# Patient Record
Sex: Female | Born: 1935 | Race: White | Hispanic: No | Smoking: Never smoker
Health system: Southern US, Community
[De-identification: ages and names within clinical notes are randomized; demographics above are authoritative.]

## PROBLEM LIST (undated history)

## (undated) DIAGNOSIS — I10 Essential (primary) hypertension: Secondary | ICD-10-CM

## (undated) DIAGNOSIS — R0600 Dyspnea, unspecified: Secondary | ICD-10-CM

## (undated) DIAGNOSIS — E785 Hyperlipidemia, unspecified: Secondary | ICD-10-CM

## (undated) DIAGNOSIS — S022XXA Fracture of nasal bones, initial encounter for closed fracture: Secondary | ICD-10-CM

## (undated) DIAGNOSIS — H3530 Unspecified macular degeneration: Secondary | ICD-10-CM

## (undated) DIAGNOSIS — N189 Chronic kidney disease, unspecified: Secondary | ICD-10-CM

## (undated) DIAGNOSIS — S42209A Unspecified fracture of upper end of unspecified humerus, initial encounter for closed fracture: Secondary | ICD-10-CM

## (undated) HISTORY — PX: TONSILLECTOMY: SUR1361

## (undated) HISTORY — PX: CHOLECYSTECTOMY: SHX55

---

## 2003-08-05 ENCOUNTER — Inpatient Hospital Stay (HOSPITAL_COMMUNITY): Admission: EM | Admit: 2003-08-05 | Discharge: 2003-08-07 | Payer: Self-pay | Admitting: Emergency Medicine

## 2019-03-24 ENCOUNTER — Emergency Department (HOSPITAL_COMMUNITY)
Admission: EM | Admit: 2019-03-24 | Discharge: 2019-03-24 | Disposition: A | Discharge status: Home / Self Care | Payer: Medicare Other | Attending: Emergency Medicine | Admitting: Emergency Medicine

## 2019-03-24 ENCOUNTER — Other Ambulatory Visit: Payer: Self-pay

## 2019-03-24 ENCOUNTER — Emergency Department (HOSPITAL_COMMUNITY): Payer: Medicare Other

## 2019-03-24 DIAGNOSIS — Y999 Unspecified external cause status: Secondary | ICD-10-CM | POA: Diagnosis not present

## 2019-03-24 DIAGNOSIS — W010XXA Fall on same level from slipping, tripping and stumbling without subsequent striking against object, initial encounter: Secondary | ICD-10-CM | POA: Diagnosis not present

## 2019-03-24 DIAGNOSIS — N189 Chronic kidney disease, unspecified: Secondary | ICD-10-CM | POA: Insufficient documentation

## 2019-03-24 DIAGNOSIS — Y92009 Unspecified place in unspecified non-institutional (private) residence as the place of occurrence of the external cause: Secondary | ICD-10-CM | POA: Insufficient documentation

## 2019-03-24 DIAGNOSIS — Y9301 Activity, walking, marching and hiking: Secondary | ICD-10-CM | POA: Diagnosis not present

## 2019-03-24 DIAGNOSIS — S42201A Unspecified fracture of upper end of right humerus, initial encounter for closed fracture: Secondary | ICD-10-CM | POA: Insufficient documentation

## 2019-03-24 DIAGNOSIS — I129 Hypertensive chronic kidney disease with stage 1 through stage 4 chronic kidney disease, or unspecified chronic kidney disease: Secondary | ICD-10-CM | POA: Diagnosis not present

## 2019-03-24 DIAGNOSIS — S022XXA Fracture of nasal bones, initial encounter for closed fracture: Secondary | ICD-10-CM | POA: Insufficient documentation

## 2019-03-24 DIAGNOSIS — Z23 Encounter for immunization: Secondary | ICD-10-CM | POA: Insufficient documentation

## 2019-03-24 DIAGNOSIS — S4991XA Unspecified injury of right shoulder and upper arm, initial encounter: Secondary | ICD-10-CM | POA: Diagnosis present

## 2019-03-24 LAB — BASIC METABOLIC PANEL
Anion gap: 11 (ref 5–15)
BUN: 18 mg/dL (ref 8–23)
CO2: 23 mmol/L (ref 22–32)
Calcium: 8.6 mg/dL — ABNORMAL LOW (ref 8.9–10.3)
Chloride: 105 mmol/L (ref 98–111)
Creatinine, Ser: 1.47 mg/dL — ABNORMAL HIGH (ref 0.44–1.00)
GFR calc Af Amer: 36 mL/min — ABNORMAL LOW (ref >60)
GFR calc non Af Amer: 31 mL/min — ABNORMAL LOW (ref >60)
Glucose, Bld: 160 mg/dL — ABNORMAL HIGH (ref 70–99)
Potassium: 3.9 mmol/L (ref 3.5–5.1)
Sodium: 139 mmol/L (ref 135–145)

## 2019-03-24 LAB — CBC WITH DIFFERENTIAL/PLATELET
Abs Immature Granulocytes: 0.06 10*3/uL (ref 0.00–0.07)
Basophils Absolute: 0 10*3/uL (ref 0.0–0.1)
Basophils Relative: 0 %
Eosinophils Absolute: 0.2 10*3/uL (ref 0.0–0.5)
Eosinophils Relative: 3 %
HCT: 36.1 % (ref 36.0–46.0)
Hemoglobin: 11.6 g/dL — ABNORMAL LOW (ref 12.0–15.0)
Immature Granulocytes: 1 %
Lymphocytes Relative: 13 %
Lymphs Abs: 1.1 10*3/uL (ref 0.7–4.0)
MCH: 33.1 pg (ref 26.0–34.0)
MCHC: 32.1 g/dL (ref 30.0–36.0)
MCV: 103.1 fL — ABNORMAL HIGH (ref 80.0–100.0)
Monocytes Absolute: 0.8 10*3/uL (ref 0.1–1.0)
Monocytes Relative: 9 %
Neutro Abs: 6.1 10*3/uL (ref 1.7–7.7)
Neutrophils Relative %: 74 %
Platelets: 174 10*3/uL (ref 150–400)
RBC: 3.5 MIL/uL — ABNORMAL LOW (ref 3.87–5.11)
RDW: 13.7 % (ref 11.5–15.5)
WBC: 8.3 10*3/uL (ref 4.0–10.5)
nRBC: 0 % (ref 0.0–0.2)

## 2019-03-24 LAB — TROPONIN I (HIGH SENSITIVITY): Troponin I (High Sensitivity): 7 ng/L (ref <18)

## 2019-03-24 MED ORDER — CLINDAMYCIN HCL 300 MG PO CAPS: 300.0000 mg | ORAL_CAPSULE | Freq: Three times a day (TID) | ORAL | 0 refills | Status: DC

## 2019-03-24 MED ORDER — CLINDAMYCIN HCL 150 MG PO CAPS
300.0000 mg | ORAL_CAPSULE | Freq: Once | ORAL | Status: AC
  Administered 2019-03-24: 19:00:00 300 mg via ORAL
  Filled 2019-03-24: qty 2

## 2019-03-24 MED ORDER — TRAMADOL HCL 50 MG PO TABS: 50.0000 mg | ORAL_TABLET | Freq: Four times a day (QID) | ORAL | 0 refills | Status: DC | PRN

## 2019-03-24 MED ORDER — TETANUS-DIPHTH-ACELL PERTUSSIS 5-2.5-18.5 LF-MCG/0.5 IM SUSP
0.5000 mL | Freq: Once | INTRAMUSCULAR | Status: AC
  Administered 2019-03-24: 0.5 mL via INTRAMUSCULAR
  Filled 2019-03-24: qty 0.5

## 2019-03-24 NOTE — Discharge Instructions (Addendum)
Take clindamycin 300 three times daily for a week. You have some nasal bone fractures that are small and you can follow up with ENT.   You have humerus (shoulder) fracture. Wear your sling   Take tylenol for pain   Take tramadol for severe pain   See primary care doctor and ENT and orthopedic doctor   Return to ER if you have severe pain, vomiting, headaches, lethargy, unable to walk

## 2019-03-24 NOTE — Progress Notes (Signed)
Orthopedic Tech Progress Note Patient Details:  Yesenia Ramsey February 01, 1925 [id number]  Ortho Devices Type of Ortho Device: Shoulder immobilizer Ortho Device/Splint Location: right Ortho Device/Splint Interventions: Application   Post Interventions Patient Tolerated: Well Instructions Provided: Care of device   [place] Pink 03/24/2019, 6:58 PM

## 2019-03-24 NOTE — ED Triage Notes (Signed)
Pt bib ems from home with reports of trip and fall onto concrete. Pt hit her R shoulder and face. Pt with abrasions to nose and above lip. Pt with swelling to R eye as well as ecchymosis. Swelling to R shoulder. Pt given 159mcg fentanyl en route. 127/55, HR 78, 93% RA, 97.7. pt alert and oriented X4.

## 2019-03-24 NOTE — ED Notes (Signed)
Patient Alert and oriented to baseline. Stable and ambulatory to baseline. Patient verbalized understanding of the discharge instructions.  Patient belongings were taken by the patient.   

## 2019-03-24 NOTE — ED Provider Notes (Signed)
[facility] Provider Note   CSN: [id number] Arrival date & time: 03/24/19  1546    History   Chief Complaint Chief Complaint  Patient presents with   Fall   Shoulder Pain    HPI Yesenia Ramsey is a 85 y.o. female history of hypertension, hyperlipidemia, kidney disease here presenting with fall.  Patient lives at home with her son.  Patient states that she had a trip and fall on hit her right shoulder and face on concrete.  She was noted to have obvious right shoulder deformity and ecchymosis on bilateral nasal bones.  There was some bleeding from the face as well     The history is provided by the patient.    No past medical history on file.  There are no active problems to display for this patient.   OB History   No obstetric history on file.      Home Medications    Prior to Admission medications   Medication Sig Start Date End Date Taking? Authorizing Provider  clindamycin (CLEOCIN) 300 MG capsule Take 1 capsule (300 mg total) by mouth 3 (three) times daily. X 7 days 03/24/19   Drenda Freeze, MD  traMADol (ULTRAM) 50 MG tablet Take 1 tablet (50 mg total) by mouth every 6 (six) hours as needed. 03/24/19   Drenda Freeze, MD    Family History No family history on file.  Social History Social History   Tobacco Use   Smoking status: Not on file  Substance Use Topics   Alcohol use: Not on file   Drug use: Not on file     Allergies   Amoxicillin   Review of Systems Review of Systems  HENT: Positive for facial swelling.   Musculoskeletal:       R shoulder pain   Skin: Positive for wound.  All other systems reviewed and are negative.    Physical Exam Updated Vital Signs BP (!) 158/60    Pulse 78    Temp 97.8 F (36.6 C) (Oral)    Resp 19    Ht 5' (1.524 m)    Wt 52.2 kg    SpO2 94%    BMI 22.46 kg/m   Physical Exam Vitals signs and nursing note reviewed.  Constitutional:      Comments:  Chronically ill, NAD   HENT:     Head:     Comments: Abrasion L scalp no laceration. There is 0.5 cm laceration of the bridge of the nose and 0.5 cm laceration above R lip. There is tenderness of the bilateral zygomatic arch with no obvious deformity     Mouth/Throat:     Mouth: Mucous membranes are moist.  Eyes:     Extraocular Movements: Extraocular movements intact.     Pupils: Pupils are equal, round, and reactive to light.  Neck:     Musculoskeletal: Normal range of motion.  Cardiovascular:     Rate and Rhythm: Normal rate and regular rhythm.     Pulses: Normal pulses.     Heart sounds: Normal heart sounds.  Pulmonary:     Effort: Pulmonary effort is normal.     Breath sounds: Normal breath sounds.  Abdominal:     General: Abdomen is flat.     Palpations: Abdomen is soft.  Musculoskeletal:     Comments: No midline spinal tenderness, nl ROM bilateral hips. There is obvious R proximal humerus deformity and tenderness. No elbow or forearm tenderness. No other  extremity trauma   Skin:    General: Skin is warm.     Capillary Refill: Capillary refill takes less than 2 seconds.  Neurological:     General: No focal deficit present.     Mental Status: She is alert and oriented to person, place, and time.     Cranial Nerves: No cranial nerve deficit.     Sensory: No sensory deficit.  Psychiatric:        Mood and Affect: Mood normal.        Behavior: Behavior normal.      ED Treatments / Results  Labs (all labs ordered are listed, but only abnormal results are displayed) Labs Reviewed  CBC WITH DIFFERENTIAL/PLATELET - Abnormal; Notable for the following components:      Result Value   RBC 3.50 (*)    Hemoglobin 11.6 (*)    MCV 103.1 (*)    All other components within normal limits  BASIC METABOLIC PANEL - Abnormal; Notable for the following components:   Glucose, Bld 160 (*)    Creatinine, Ser 1.47 (*)    Calcium 8.6 (*)    GFR calc non Af Amer 31 (*)    GFR calc Af  Amer 36 (*)    All other components within normal limits  TROPONIN I (HIGH SENSITIVITY)    EKG EKG Interpretation  Date/Time:  Thursday March 24 2019 15:58:21 EDT Ventricular Rate:  75 PR Interval:    QRS Duration: 82 QT Interval:  541 QTC Calculation: 605 R Axis:   57 Text Interpretation:  Sinus rhythm Borderline T abnormalities, inferior leads Prolonged QT interval Since last tracing rate slower Confirmed by Richardean CanalYao, Leahanna Buser H [phone]) on 03/24/2019 4:25:58 PM   Radiology Dg Chest 1 View  Result Date: 03/24/2019 CLINICAL DATA:  Fall with right shoulder pain EXAM: CHEST  1 VIEW COMPARISON:  None FINDINGS: Small left-sided pleural effusion. Normal heart size. Aortic atherosclerosis. No pneumothorax. Acute fracture proximal right humerus. IMPRESSION: Small left-sided pleural effusion. Acute fracture proximal right humerus Electronically Signed   By: Jasmine PangKim  Fujinaga M.D.   On: 03/24/2019 16:43   Dg Pelvis 1-2 Views  Result Date: 03/24/2019 CLINICAL DATA:  Fall EXAM: PELVIS - 1-2 VIEW COMPARISON:  None. FINDINGS: SI joints are non widened. Pubic symphysis and rami are intact. No fracture or malalignment. IMPRESSION: No acute osseous abnormality Electronically Signed   By: Jasmine PangKim  Fujinaga M.D.   On: 03/24/2019 16:49   Dg Shoulder Right  Result Date: 03/24/2019 CLINICAL DATA:  Fall EXAM: RIGHT SHOULDER - 2+ VIEW COMPARISON:  None. FINDINGS: Moderate AC joint degenerative change. Acute mildly comminuted and impacted fracture involving the right humeral neck with close to 1 bone with of anterior displacement of distal fracture fragment. The humeral head appears to project over the glenoid fossa. IMPRESSION: Acute mildly comminuted and impacted right humeral neck fracture. Electronically Signed   By: Jasmine PangKim  Fujinaga M.D.   On: 03/24/2019 16:48   Ct Head Wo Contrast  Result Date: 03/24/2019 CLINICAL DATA:  Trip and fall at home onto concrete. Swelling and ecchymosis to right eye. Head trauma, intracranial  venous injury suspected; Head trauma, CSF leak suspected; C-spine trauma, high clinical risk (NEXUS/CCR) EXAM: CT HEAD WITHOUT CONTRAST CT MAXILLOFACIAL WITHOUT CONTRAST CT CERVICAL SPINE WITHOUT CONTRAST TECHNIQUE: Multidetector CT imaging of the head, cervical spine, and maxillofacial structures were performed using the standard protocol without intravenous contrast. Multiplanar CT image reconstructions of the cervical spine and maxillofacial structures were also generated. COMPARISON:  None.  FINDINGS: CT HEAD FINDINGS Brain: No evidence of acute infarction, hemorrhage, hydrocephalus, extra-axial collection or mass lesion/mass effect. Age related atrophy. Moderate chronic small vessel ischemia. Remote lacunar infarct in the left caudate/thalamus. Vascular: Hyperdense vessel. Skull: No fracture or focal lesion. Other: None. CT MAXILLOFACIAL FINDINGS Osseous: Comminuted and depressed bilateral nasal bone fractures. Slight rightward nasal septal deviation. Possible but not definite and right anterior alveolar ridge fracture. Upper dentures in place. Zygomatic arches are intact. Mandibles are intact, temporomandibular joints are congruent with bilateral degenerative change. Orbits: No acute orbital fracture. No globe injury. Bilateral lens extraction. Right periorbital hematoma. Sinuses: No sinus fracture or fluid level. Slight mucosal thickening of ethmoid air cells. The mastoid air cells are clear. Soft tissues: Right periorbital hematoma and edema. CT CERVICAL SPINE FINDINGS Alignment: No traumatic subluxation. Slight anterolisthesis of C4 on C5 and retrolisthesis of C5 on C6 appears degenerative. Skull base and vertebrae: No acute fracture. The dens and skull base are intact. No focal bone abnormality. Soft tissues and spinal canal: No prevertebral fluid or swelling. No visible canal hematoma. Disc levels: Disc space narrowing and endplate spurring is most prominent at C5-C6. Multilevel facet hypertrophy. Upper  chest: Biapical pleuroparenchymal scarring. No acute findings. Other: None. IMPRESSION: 1. No acute intracranial abnormality. No skull fracture. Age related atrophy and chronic small vessel ischemia. 2. Comminuted and depressed bilateral nasal bone fractures. Possible but not definite right anterior alveolar ridge fracture. 3. Right periorbital hematoma and edema. No orbital fracture. 4. Degenerative change in the cervical spine without acute fracture or subluxation. Electronically Signed   By: Narda RutherfordMelanie  Sanford M.D.   On: 03/24/2019 17:14   Ct Cervical Spine Wo Contrast  Result Date: 03/24/2019 CLINICAL DATA:  Trip and fall at home onto concrete. Swelling and ecchymosis to right eye. Head trauma, intracranial venous injury suspected; Head trauma, CSF leak suspected; C-spine trauma, high clinical risk (NEXUS/CCR) EXAM: CT HEAD WITHOUT CONTRAST CT MAXILLOFACIAL WITHOUT CONTRAST CT CERVICAL SPINE WITHOUT CONTRAST TECHNIQUE: Multidetector CT imaging of the head, cervical spine, and maxillofacial structures were performed using the standard protocol without intravenous contrast. Multiplanar CT image reconstructions of the cervical spine and maxillofacial structures were also generated. COMPARISON:  None. FINDINGS: CT HEAD FINDINGS Brain: No evidence of acute infarction, hemorrhage, hydrocephalus, extra-axial collection or mass lesion/mass effect. Age related atrophy. Moderate chronic small vessel ischemia. Remote lacunar infarct in the left caudate/thalamus. Vascular: Hyperdense vessel. Skull: No fracture or focal lesion. Other: None. CT MAXILLOFACIAL FINDINGS Osseous: Comminuted and depressed bilateral nasal bone fractures. Slight rightward nasal septal deviation. Possible but not definite and right anterior alveolar ridge fracture. Upper dentures in place. Zygomatic arches are intact. Mandibles are intact, temporomandibular joints are congruent with bilateral degenerative change. Orbits: No acute orbital fracture.  No globe injury. Bilateral lens extraction. Right periorbital hematoma. Sinuses: No sinus fracture or fluid level. Slight mucosal thickening of ethmoid air cells. The mastoid air cells are clear. Soft tissues: Right periorbital hematoma and edema. CT CERVICAL SPINE FINDINGS Alignment: No traumatic subluxation. Slight anterolisthesis of C4 on C5 and retrolisthesis of C5 on C6 appears degenerative. Skull base and vertebrae: No acute fracture. The dens and skull base are intact. No focal bone abnormality. Soft tissues and spinal canal: No prevertebral fluid or swelling. No visible canal hematoma. Disc levels: Disc space narrowing and endplate spurring is most prominent at C5-C6. Multilevel facet hypertrophy. Upper chest: Biapical pleuroparenchymal scarring. No acute findings. Other: None. IMPRESSION: 1. No acute intracranial abnormality. No skull fracture. Age related atrophy and  chronic small vessel ischemia. 2. Comminuted and depressed bilateral nasal bone fractures. Possible but not definite right anterior alveolar ridge fracture. 3. Right periorbital hematoma and edema. No orbital fracture. 4. Degenerative change in the cervical spine without acute fracture or subluxation. Electronically Signed   By: Narda Rutherford M.D.   On: 03/24/2019 17:14   Ct Maxillofacial Wo Contrast  Result Date: 03/24/2019 CLINICAL DATA:  Trip and fall at home onto concrete. Swelling and ecchymosis to right eye. Head trauma, intracranial venous injury suspected; Head trauma, CSF leak suspected; C-spine trauma, high clinical risk (NEXUS/CCR) EXAM: CT HEAD WITHOUT CONTRAST CT MAXILLOFACIAL WITHOUT CONTRAST CT CERVICAL SPINE WITHOUT CONTRAST TECHNIQUE: Multidetector CT imaging of the head, cervical spine, and maxillofacial structures were performed using the standard protocol without intravenous contrast. Multiplanar CT image reconstructions of the cervical spine and maxillofacial structures were also generated. COMPARISON:  None.  FINDINGS: CT HEAD FINDINGS Brain: No evidence of acute infarction, hemorrhage, hydrocephalus, extra-axial collection or mass lesion/mass effect. Age related atrophy. Moderate chronic small vessel ischemia. Remote lacunar infarct in the left caudate/thalamus. Vascular: Hyperdense vessel. Skull: No fracture or focal lesion. Other: None. CT MAXILLOFACIAL FINDINGS Osseous: Comminuted and depressed bilateral nasal bone fractures. Slight rightward nasal septal deviation. Possible but not definite and right anterior alveolar ridge fracture. Upper dentures in place. Zygomatic arches are intact. Mandibles are intact, temporomandibular joints are congruent with bilateral degenerative change. Orbits: No acute orbital fracture. No globe injury. Bilateral lens extraction. Right periorbital hematoma. Sinuses: No sinus fracture or fluid level. Slight mucosal thickening of ethmoid air cells. The mastoid air cells are clear. Soft tissues: Right periorbital hematoma and edema. CT CERVICAL SPINE FINDINGS Alignment: No traumatic subluxation. Slight anterolisthesis of C4 on C5 and retrolisthesis of C5 on C6 appears degenerative. Skull base and vertebrae: No acute fracture. The dens and skull base are intact. No focal bone abnormality. Soft tissues and spinal canal: No prevertebral fluid or swelling. No visible canal hematoma. Disc levels: Disc space narrowing and endplate spurring is most prominent at C5-C6. Multilevel facet hypertrophy. Upper chest: Biapical pleuroparenchymal scarring. No acute findings. Other: None. IMPRESSION: 1. No acute intracranial abnormality. No skull fracture. Age related atrophy and chronic small vessel ischemia. 2. Comminuted and depressed bilateral nasal bone fractures. Possible but not definite right anterior alveolar ridge fracture. 3. Right periorbital hematoma and edema. No orbital fracture. 4. Degenerative change in the cervical spine without acute fracture or subluxation. Electronically Signed   By:  Narda Rutherford M.D.   On: 03/24/2019 17:14    Procedures Procedures (including critical care time) LACERATION REPAIR Performed by: Richardean Canal Authorized by: Richardean Canal Consent: Verbal consent obtained. Risks and benefits: risks, benefits and alternatives were discussed Consent given by: patient Patient identity confirmed: provided demographic data Prepped and Draped in normal sterile fashion Wound explored  Laceration Location: above the lip, nasal bone  Laceration Length: 0.5 cm  No Foreign Bodies seen or palpated  Anesthesia: none  Irrigation method: syringe Amount of cleaning: standard  Skin closure: dermabond  Patient tolerance: Patient tolerated the procedure well with no immediate complications.    Medications Ordered in ED Medications  clindamycin (CLEOCIN) capsule 300 mg (has no administration in time range)     Initial Impression / Assessment and Plan / ED Course  I have reviewed the triage vital signs and the nursing notes.  Pertinent labs & imaging results that were available during my care of the patient were reviewed by me and considered in my  medical decision making (see chart for details).       Kathie RhodesBetty Gawthrop is a 85 y.o. female here presenting with fall.  Had a mechanical fall and hit her face and right shoulder.  She also has a small laceration of the bridge of the nose and one above the lip that was able to apply Dermabond to.  I updated her tetanus and will get CT head neck and face and right shoulder x-rays.  7:08 PM CT head/neck/face showed nasal bone fractures. Also has R proximal humerus fracture. Shoulder immobilizer placed. Will dc home with clindamycin for nasal bone fractures. Will have her follow up with ENT and ortho.      Final Clinical Impressions(s) / ED Diagnoses   Final diagnoses:  Closed fracture of proximal end of right humerus, unspecified fracture morphology, initial encounter  Closed fracture of nasal bone, initial  encounter    ED Discharge Orders         Ordered    clindamycin (CLEOCIN) 300 MG capsule  3 times daily     03/24/19 1850    traMADol (ULTRAM) 50 MG tablet  Every 6 hours PRN     03/24/19 1850           Charlynne PanderYao, Beau Vanduzer Hsienta, MD 03/24/19 1909

## 2019-04-01 ENCOUNTER — Encounter (HOSPITAL_BASED_OUTPATIENT_CLINIC_OR_DEPARTMENT_OTHER): Payer: Self-pay | Admitting: *Deleted

## 2019-04-01 ENCOUNTER — Ambulatory Visit (INDEPENDENT_AMBULATORY_CARE_PROVIDER_SITE_OTHER): Payer: Medicare Other | Admitting: Plastic Surgery

## 2019-04-01 ENCOUNTER — Encounter: Payer: Self-pay | Admitting: Plastic Surgery

## 2019-04-01 ENCOUNTER — Other Ambulatory Visit: Payer: Self-pay

## 2019-04-01 DIAGNOSIS — S022XXA Fracture of nasal bones, initial encounter for closed fracture: Secondary | ICD-10-CM | POA: Insufficient documentation

## 2019-04-01 NOTE — Progress Notes (Signed)
     Patient ID: Yesenia Ramsey, female    DOB: 04/21/1925, 85 y.o.   MRN: [id number]   Chief Complaint  Patient presents with  . Facial Injury    The patient is a 85-year-old female here for follow-up on her facial injury.  She tripped while at home last week.  She was seen in the emergency rooNo acute intracranial abnormality. She has a fracture of her humerus.  She has a lot of ecchymosis and bruising of her face.  She has comminuted and depressed bilateral nasal bone fractures with a right periorbital hematoma. Vision is intact.  She is here with her son.  She does not have any trouble breathing at this point time.  She has obvious deformity of her nose with the left nasal bone positioned laterally.  The right nasal bone is positioned medially.   Review of Systems  Constitutional: Positive for activity change. Negative for appetite change.  Eyes: Negative for discharge.  Respiratory: Negative for apnea and shortness of breath.   Cardiovascular: Negative for leg swelling.  Gastrointestinal: Negative for abdominal pain.  Genitourinary: Negative.   Musculoskeletal: Negative.   Hematological: Negative.   Psychiatric/Behavioral: Negative.     History reviewed. No pertinent past medical history.  History reviewed. No pertinent surgical history.    Current Outpatient Medications:  .  clindamycin (CLEOCIN) 300 MG capsule, Take 1 capsule (300 mg total) by mouth 3 (three) times daily. X 7 days, Disp: 21 capsule, Rfl: 0 .  traMADol (ULTRAM) 50 MG tablet, Take 1 tablet (50 mg total) by mouth every 6 (six) hours as needed., Disp: 15 tablet, Rfl: 0   Objective:   Vitals:   04/01/19 0900  BP: 130/77  Pulse: 84  Temp: (!) 97.5 F (36.4 C)  SpO2: 100%    Physical Exam Vitals signs and nursing note reviewed.  Constitutional:      Appearance: Normal appearance.  HENT:     Head: Normocephalic and atraumatic.     Nose:     Comments: Fractured and displaced Eyes:     Extraocular  Movements: Extraocular movements intact.  Cardiovascular:     Rate and Rhythm: Normal rate.     Pulses: Normal pulses.  Pulmonary:     Effort: Pulmonary effort is normal. No respiratory distress.  Skin:    General: Skin is warm.  Neurological:     General: No focal deficit present.     Mental Status: She is alert.  Psychiatric:        Mood and Affect: Mood normal.        Behavior: Behavior normal.     Assessment & Plan:  Closed fracture of nasal bone, initial encounter  Recommend close nasal reduction with splinting.  She does not have to do this I just worry that she is could have trouble breathing because of how dramatic the displacement is at this time. Pictures were obtained of the patient and placed in the chart with the patient's or guardian's permission.  Claire S Dillingham, DO 

## 2019-04-01 NOTE — H&P (View-Only) (Signed)
     Patient ID: Yesenia Ramsey, female    DOB: 09/20/1924, 85 y.o.   MRN: [id number]   Chief Complaint  Patient presents with  . Facial Injury    The patient is a 85 year old female here for follow-up on her facial injury.  She tripped while at home last week.  She was seen in the emergency rooNo acute intracranial abnormality. She has a fracture of her humerus.  She has a lot of ecchymosis and bruising of her face.  She has comminuted and depressed bilateral nasal bone fractures with a right periorbital hematoma. Vision is intact.  She is here with her son.  She does not have any trouble breathing at this point time.  She has obvious deformity of her nose with the left nasal bone positioned laterally.  The right nasal bone is positioned medially.   Review of Systems  Constitutional: Positive for activity change. Negative for appetite change.  Eyes: Negative for discharge.  Respiratory: Negative for apnea and shortness of breath.   Cardiovascular: Negative for leg swelling.  Gastrointestinal: Negative for abdominal pain.  Genitourinary: Negative.   Musculoskeletal: Negative.   Hematological: Negative.   Psychiatric/Behavioral: Negative.     History reviewed. No pertinent past medical history.  History reviewed. No pertinent surgical history.    Current Outpatient Medications:  .  clindamycin (CLEOCIN) 300 MG capsule, Take 1 capsule (300 mg total) by mouth 3 (three) times daily. X 7 days, Disp: 21 capsule, Rfl: 0 .  traMADol (ULTRAM) 50 MG tablet, Take 1 tablet (50 mg total) by mouth every 6 (six) hours as needed., Disp: 15 tablet, Rfl: 0   Objective:   Vitals:   04/01/19 0900  BP: 130/77  Pulse: 84  Temp: (!) 97.5 F (36.4 C)  SpO2: 100%    Physical Exam Vitals signs and nursing note reviewed.  Constitutional:      Appearance: Normal appearance.  HENT:     Head: Normocephalic and atraumatic.     Nose:     Comments: Fractured and displaced Eyes:     Extraocular  Movements: Extraocular movements intact.  Cardiovascular:     Rate and Rhythm: Normal rate.     Pulses: Normal pulses.  Pulmonary:     Effort: Pulmonary effort is normal. No respiratory distress.  Skin:    General: Skin is warm.  Neurological:     General: No focal deficit present.     Mental Status: She is alert.  Psychiatric:        Mood and Affect: Mood normal.        Behavior: Behavior normal.     Assessment & Plan:  Closed fracture of nasal bone, initial encounter  Recommend close nasal reduction with splinting.  She does not have to do this I just worry that she is could have trouble breathing because of how dramatic the displacement is at this time. Pictures were obtained of the patient and placed in the chart with the patient's or guardian's permission.  [place], DO

## 2019-04-04 ENCOUNTER — Other Ambulatory Visit (HOSPITAL_COMMUNITY)
Admission: RE | Admit: 2019-04-04 | Discharge: 2019-04-04 | Disposition: A | Discharge status: Home / Self Care | Payer: Medicare Other | Source: Ambulatory Visit | Attending: Plastic Surgery | Admitting: Plastic Surgery

## 2019-04-04 DIAGNOSIS — S022XXA Fracture of nasal bones, initial encounter for closed fracture: Secondary | ICD-10-CM | POA: Insufficient documentation

## 2019-04-04 DIAGNOSIS — Z20828 Contact with and (suspected) exposure to other viral communicable diseases: Secondary | ICD-10-CM | POA: Insufficient documentation

## 2019-04-04 DIAGNOSIS — Z01812 Encounter for preprocedural laboratory examination: Secondary | ICD-10-CM | POA: Insufficient documentation

## 2019-04-04 LAB — SARS CORONAVIRUS 2 (TAT 6-24 HRS): SARS Coronavirus 2: NEGATIVE

## 2019-04-07 ENCOUNTER — Ambulatory Visit (HOSPITAL_BASED_OUTPATIENT_CLINIC_OR_DEPARTMENT_OTHER): Payer: Medicare Other | Admitting: Certified Registered"

## 2019-04-07 ENCOUNTER — Ambulatory Visit (HOSPITAL_BASED_OUTPATIENT_CLINIC_OR_DEPARTMENT_OTHER)
Admission: RE | Admit: 2019-04-07 | Discharge: 2019-04-07 | Disposition: A | Discharge status: Home / Self Care | Payer: Medicare Other | Attending: Plastic Surgery | Admitting: Plastic Surgery

## 2019-04-07 ENCOUNTER — Other Ambulatory Visit: Payer: Self-pay

## 2019-04-07 ENCOUNTER — Encounter (HOSPITAL_BASED_OUTPATIENT_CLINIC_OR_DEPARTMENT_OTHER): Payer: Self-pay | Admitting: *Deleted

## 2019-04-07 ENCOUNTER — Encounter (HOSPITAL_BASED_OUTPATIENT_CLINIC_OR_DEPARTMENT_OTHER)
Admission: RE | Disposition: A | Discharge status: Home / Self Care | Payer: Self-pay | Source: Home / Self Care | Attending: Plastic Surgery

## 2019-04-07 DIAGNOSIS — Y92009 Unspecified place in unspecified non-institutional (private) residence as the place of occurrence of the external cause: Secondary | ICD-10-CM | POA: Diagnosis not present

## 2019-04-07 DIAGNOSIS — W010XXA Fall on same level from slipping, tripping and stumbling without subsequent striking against object, initial encounter: Secondary | ICD-10-CM | POA: Insufficient documentation

## 2019-04-07 DIAGNOSIS — S022XXA Fracture of nasal bones, initial encounter for closed fracture: Secondary | ICD-10-CM | POA: Insufficient documentation

## 2019-04-07 DIAGNOSIS — I10 Essential (primary) hypertension: Secondary | ICD-10-CM | POA: Diagnosis not present

## 2019-04-07 HISTORY — DX: Hyperlipidemia, unspecified: E78.5

## 2019-04-07 HISTORY — DX: Unspecified fracture of upper end of unspecified humerus, initial encounter for closed fracture: S42.209A

## 2019-04-07 HISTORY — DX: Chronic kidney disease, unspecified: N18.9

## 2019-04-07 HISTORY — DX: Essential (primary) hypertension: I10

## 2019-04-07 HISTORY — PX: CLOSED REDUCTION NASAL FRACTURE: SHX5365

## 2019-04-07 HISTORY — DX: Fracture of nasal bones, initial encounter for closed fracture: S02.2XXA

## 2019-04-07 HISTORY — DX: Dyspnea, unspecified: R06.00

## 2019-04-07 HISTORY — DX: Unspecified macular degeneration: H35.30

## 2019-04-07 SURGERY — CLOSED REDUCTION, FRACTURE, NASAL BONE
Anesthesia: General | Site: Nose

## 2019-04-07 MED ORDER — ACETAMINOPHEN 325 MG PO TABS: 650.0000 mg | ORAL_TABLET | ORAL | Status: DC | PRN

## 2019-04-07 MED ORDER — CIPROFLOXACIN IN D5W 400 MG/200ML IV SOLN
400.0000 mg | INTRAVENOUS | Status: AC
  Administered 2019-04-07: 12:00:00 400 mg via INTRAVENOUS

## 2019-04-07 MED ORDER — SODIUM CHLORIDE 0.9 % IV SOLN: 250.0000 mL | INTRAVENOUS | Status: DC | PRN

## 2019-04-07 MED ORDER — SCOPOLAMINE 1 MG/3DAYS TD PT72: 1.0000 | MEDICATED_PATCH | Freq: Once | TRANSDERMAL | Status: DC

## 2019-04-07 MED ORDER — FENTANYL CITRATE (PF) 100 MCG/2ML IJ SOLN
50.0000 ug | INTRAMUSCULAR | Status: DC | PRN
  Administered 2019-04-07 (×2): 25 ug via INTRAVENOUS

## 2019-04-07 MED ORDER — SODIUM CHLORIDE 0.9% FLUSH: 3.0000 mL | INTRAVENOUS | Status: DC | PRN

## 2019-04-07 MED ORDER — CHLORHEXIDINE GLUCONATE CLOTH 2 % EX PADS: 6.0000 | MEDICATED_PAD | Freq: Once | CUTANEOUS | Status: DC

## 2019-04-07 MED ORDER — HYDROCODONE-ACETAMINOPHEN 5-325 MG PO TABS: 1.0000 | ORAL_TABLET | Freq: Four times a day (QID) | ORAL | 0 refills | Status: DC | PRN

## 2019-04-07 MED ORDER — FENTANYL CITRATE (PF) 100 MCG/2ML IJ SOLN
INTRAMUSCULAR | Status: AC
  Filled 2019-04-07: qty 2

## 2019-04-07 MED ORDER — ONDANSETRON HCL 4 MG/2ML IJ SOLN
INTRAMUSCULAR | Status: AC
  Filled 2019-04-07: qty 2

## 2019-04-07 MED ORDER — PROPOFOL 10 MG/ML IV BOLUS
INTRAVENOUS | Status: AC
  Filled 2019-04-07: qty 20

## 2019-04-07 MED ORDER — ACETAMINOPHEN 650 MG RE SUPP: 650.0000 mg | RECTAL | Status: DC | PRN

## 2019-04-07 MED ORDER — LIDOCAINE 2% (20 MG/ML) 5 ML SYRINGE
INTRAMUSCULAR | Status: DC | PRN
  Administered 2019-04-07: 60 mg via INTRAVENOUS

## 2019-04-07 MED ORDER — BUPIVACAINE-EPINEPHRINE (PF) 0.25% -1:200000 IJ SOLN
INTRAMUSCULAR | Status: AC
  Filled 2019-04-07: qty 30

## 2019-04-07 MED ORDER — OXYMETAZOLINE HCL 0.05 % NA SOLN
NASAL | Status: DC | PRN
  Administered 2019-04-07: 1 via TOPICAL

## 2019-04-07 MED ORDER — OXYMETAZOLINE HCL 0.05 % NA SOLN
NASAL | Status: AC
  Filled 2019-04-07: qty 30

## 2019-04-07 MED ORDER — BACITRACIN ZINC 500 UNIT/GM EX OINT
TOPICAL_OINTMENT | CUTANEOUS | Status: AC
  Filled 2019-04-07: qty 28.35

## 2019-04-07 MED ORDER — OXYCODONE HCL 5 MG PO TABS: 5.0000 mg | ORAL_TABLET | ORAL | Status: DC | PRN

## 2019-04-07 MED ORDER — LIDOCAINE-EPINEPHRINE 1 %-1:100000 IJ SOLN
INTRAMUSCULAR | Status: DC | PRN
  Administered 2019-04-07: 2 mL

## 2019-04-07 MED ORDER — SODIUM CHLORIDE 0.9% FLUSH: 3.0000 mL | Freq: Two times a day (BID) | INTRAVENOUS | Status: DC

## 2019-04-07 MED ORDER — CIPROFLOXACIN IN D5W 400 MG/200ML IV SOLN
INTRAVENOUS | Status: AC
  Filled 2019-04-07: qty 200

## 2019-04-07 MED ORDER — FENTANYL CITRATE (PF) 100 MCG/2ML IJ SOLN: 25.0000 ug | INTRAMUSCULAR | Status: DC | PRN

## 2019-04-07 MED ORDER — PROPOFOL 10 MG/ML IV BOLUS
INTRAVENOUS | Status: DC | PRN
  Administered 2019-04-07: 80 mg via INTRAVENOUS

## 2019-04-07 MED ORDER — ONDANSETRON HCL 4 MG/2ML IJ SOLN
INTRAMUSCULAR | Status: DC | PRN
  Administered 2019-04-07: 4 mg via INTRAVENOUS

## 2019-04-07 MED ORDER — EPHEDRINE SULFATE 50 MG/ML IJ SOLN
INTRAMUSCULAR | Status: DC | PRN
  Administered 2019-04-07: 5 mg via INTRAVENOUS
  Administered 2019-04-07: 15 mg via INTRAVENOUS

## 2019-04-07 MED ORDER — LACTATED RINGERS IV SOLN
INTRAVENOUS | Status: DC
  Administered 2019-04-07: 11:00:00 via INTRAVENOUS

## 2019-04-07 MED ORDER — MIDAZOLAM HCL 2 MG/2ML IJ SOLN: 1.0000 mg | INTRAMUSCULAR | Status: DC | PRN

## 2019-04-07 MED ORDER — LIDOCAINE HCL (CARDIAC) PF 100 MG/5ML IV SOSY: PREFILLED_SYRINGE | INTRAVENOUS | Status: DC | PRN

## 2019-04-07 MED ORDER — LIDOCAINE-EPINEPHRINE 1 %-1:100000 IJ SOLN
INTRAMUSCULAR | Status: AC
  Filled 2019-04-07: qty 1

## 2019-04-07 MED ORDER — DEXAMETHASONE SODIUM PHOSPHATE 10 MG/ML IJ SOLN
INTRAMUSCULAR | Status: AC
  Filled 2019-04-07: qty 1

## 2019-04-07 SURGICAL SUPPLY — 53 items
APPLICATOR COTTON TIP 6 STRL (MISCELLANEOUS) ×1 IMPLANT
APPLICATOR COTTON TIP 6IN STRL (MISCELLANEOUS) ×3
BENZOIN TINCTURE PRP APPL 2/3 (GAUZE/BANDAGES/DRESSINGS) ×3 IMPLANT
BLADE SURG 15 STRL LF DISP TIS (BLADE) IMPLANT
BLADE SURG 15 STRL SS (BLADE)
CANISTER SUCT 1200ML W/VALVE (MISCELLANEOUS) ×3 IMPLANT
CLOSURE WOUND 1/2 X4 (GAUZE/BANDAGES/DRESSINGS) ×1
COVER BACK TABLE REUSABLE LG (DRAPES) ×3 IMPLANT
COVER MAYO STAND REUSABLE (DRAPES) ×3 IMPLANT
COVER WAND RF STERILE (DRAPES) IMPLANT
DECANTER SPIKE VIAL GLASS SM (MISCELLANEOUS) IMPLANT
DERMABOND ADVANCED (GAUZE/BANDAGES/DRESSINGS)
DERMABOND ADVANCED .7 DNX12 (GAUZE/BANDAGES/DRESSINGS) IMPLANT
DRAPE HALF SHEET 70X43 (DRAPES) ×3 IMPLANT
DRSG NASOPORE 8CM (GAUZE/BANDAGES/DRESSINGS) IMPLANT
ELECT NDL BLADE 2-5/6 (NEEDLE) IMPLANT
ELECT NEEDLE BLADE 2-5/6 (NEEDLE) IMPLANT
ELECT REM PT RETURN 9FT ADLT (ELECTROSURGICAL)
ELECTRODE REM PT RTRN 9FT ADLT (ELECTROSURGICAL) IMPLANT
GAUZE VASELINE FOILPK 1/2 X 72 (GAUZE/BANDAGES/DRESSINGS) IMPLANT
GLOVE BIO SURGEON STRL SZ 6.5 (GLOVE) ×3 IMPLANT
GLOVE BIO SURGEON STRL SZ7 (GLOVE) ×2 IMPLANT
GLOVE BIO SURGEONS STRL SZ 6.5 (GLOVE) ×2
GLOVE BIOGEL PI IND STRL 6.5 (GLOVE) IMPLANT
GLOVE BIOGEL PI INDICATOR 6.5 (GLOVE) ×2
GLOVE ECLIPSE 6.5 STRL STRAW (GLOVE) ×2 IMPLANT
GOWN STRL REUS W/ TWL LRG LVL3 (GOWN DISPOSABLE) ×2 IMPLANT
GOWN STRL REUS W/TWL LRG LVL3 (GOWN DISPOSABLE) ×6
KIT SPLINT NASAL DENVER LRG BE (GAUZE/BANDAGES/DRESSINGS) IMPLANT
KIT SPLINT NASAL DENVER MIN BE (GAUZE/BANDAGES/DRESSINGS) ×2 IMPLANT
KIT SPLINT NASAL DENVER PET BE (GAUZE/BANDAGES/DRESSINGS) IMPLANT
KIT SPLINT NASAL DENVER SM BEI (GAUZE/BANDAGES/DRESSINGS) IMPLANT
NDL PRECISIONGLIDE 27X1.5 (NEEDLE) IMPLANT
NEEDLE PRECISIONGLIDE 27X1.5 (NEEDLE) ×3 IMPLANT
PACK BASIN DAY SURGERY FS (CUSTOM PROCEDURE TRAY) ×3 IMPLANT
PATTIES SURGICAL .5 X3 (DISPOSABLE) ×3 IMPLANT
PENCIL BUTTON HOLSTER BLD 10FT (ELECTRODE) IMPLANT
SPLINT NASAL AIRWAY SILICONE (MISCELLANEOUS) IMPLANT
SPLINT NASAL THERMO PLAST (MISCELLANEOUS) IMPLANT
SPONGE GAUZE 2X2 8PLY STER LF (GAUZE/BANDAGES/DRESSINGS)
SPONGE GAUZE 2X2 8PLY STRL LF (GAUZE/BANDAGES/DRESSINGS) IMPLANT
STRIP CLOSURE SKIN 1/2X4 (GAUZE/BANDAGES/DRESSINGS) ×2 IMPLANT
SUT CHROMIC 6 0 PS 4 (SUTURE) IMPLANT
SUT ETHILON 3 0 PS 1 (SUTURE) IMPLANT
SUT SILK 2 0 PERMA HAND 18 BK (SUTURE) IMPLANT
SUT SILK 3 0 SH 30 (SUTURE) IMPLANT
SYR CONTROL 10ML LL (SYRINGE) ×2 IMPLANT
TOWEL GREEN STERILE FF (TOWEL DISPOSABLE) ×3 IMPLANT
TRAY DSU PREP LF (CUSTOM PROCEDURE TRAY) ×3 IMPLANT
TUBE CONNECTING 20'X1/4 (TUBING) ×1
TUBE CONNECTING 20X1/4 (TUBING) ×2 IMPLANT
TUBE SALEM SUMP 16 FR W/ARV (TUBING) ×3 IMPLANT
YANKAUER SUCT BULB TIP NO VENT (SUCTIONS) IMPLANT

## 2019-04-07 NOTE — Anesthesia Postprocedure Evaluation (Signed)
Anesthesia Post Note  Patient: Yesenia Ramsey  Procedure(s) Performed: CLOSED REDUCTION NASAL FRACTURE (N/A Nose)     Patient location during evaluation: PACU Anesthesia Type: General Level of consciousness: awake and alert Pain management: pain level controlled Vital Signs Assessment: post-procedure vital signs reviewed and stable Respiratory status: spontaneous breathing, nonlabored ventilation, respiratory function stable and patient connected to nasal cannula oxygen Cardiovascular status: blood pressure returned to baseline and stable Postop Assessment: no apparent nausea or vomiting Anesthetic complications: no    Last Vitals:  Vitals:   04/07/19 1200 04/07/19 1239  BP:  (!) 150/68  Pulse:  (!) 133  Resp:  16  Temp: (!) 36.4 C 36.5 C  SpO2:  99%    Last Pain:  Vitals:   04/07/19 1239  TempSrc:   PainSc: 0-No pain                 Effie Berkshire

## 2019-04-07 NOTE — Transfer of Care (Signed)
Immediate Anesthesia Transfer of Care Note  Patient: Yesenia Ramsey  Procedure(s) Performed: CLOSED REDUCTION NASAL FRACTURE (N/A Nose)  Patient Location: PACU  Anesthesia Type:General  Level of Consciousness: Drowsy  Airway & Oxygen Therapy: Patient Spontanous Breathing and Patient connected to face mask oxygen  Post-op Assessment: Report given to RN and Post -op Vital signs reviewed and stable  Post vital signs: Reviewed and stable  Last Vitals:  Vitals Value Taken Time  BP 133/55 04/07/19 1200  Temp    Pulse 66 04/07/19 1201  Resp 13 04/07/19 1201  SpO2 100 % 04/07/19 1201  Vitals shown include unvalidated device data.  Last Pain:  Vitals:   04/07/19 1055  TempSrc: Oral  PainSc: 1          Complications: No apparent anesthesia complications

## 2019-04-07 NOTE — Interval H&P Note (Signed)
History and Physical Interval Note:  04/07/2019 10:44 AM  Yesenia Ramsey  has presented today for surgery, with the diagnosis of nasal fracture.  The various methods of treatment have been discussed with the patient and family. After consideration of risks, benefits and other options for treatment, the patient has consented to  Procedure(s): CLOSED REDUCTION NASAL FRACTURE (N/A) as a surgical intervention.  The patient's history has been reviewed, patient examined, no change in status, stable for surgery.  I have reviewed the patient's chart and labs.  Questions were answered to the patient's satisfaction.     [name]

## 2019-04-07 NOTE — Anesthesia Preprocedure Evaluation (Addendum)
Anesthesia Evaluation  Patient identified by MRN, date of birth, ID band Patient awake    Reviewed: Allergy & Precautions, NPO status , Patient's Chart, lab work & pertinent test results  Airway Mallampati: I  TM Distance: >3 FB Neck ROM: Full    Dental  (+) Dental Advisory Given, Upper Dentures, Partial Lower   Pulmonary neg pulmonary ROS,    breath sounds clear to auscultation       Cardiovascular hypertension,  Rhythm:Regular Rate:Normal     Neuro/Psych negative neurological ROS  negative psych ROS   GI/Hepatic negative GI ROS, Neg liver ROS,   Endo/Other  negative endocrine ROS  Renal/GU Renal InsufficiencyRenal disease     Musculoskeletal negative musculoskeletal ROS (+)   Abdominal Normal abdominal exam  (+)   Peds  Hematology negative hematology ROS (+)   Anesthesia Other Findings   Reproductive/Obstetrics                            Anesthesia Physical Anesthesia Plan  ASA: III  Anesthesia Plan: General   Post-op Pain Management:    Induction: Intravenous  PONV Risk Score and Plan: 4 or greater and Ondansetron and Treatment may vary due to age or medical condition  Airway Management Planned: LMA  Additional Equipment: None  Intra-op Plan:   Post-operative Plan: Extubation in OR  Informed Consent: I have reviewed the patients History and Physical, chart, labs and discussed the procedure including the risks, benefits and alternatives for the proposed anesthesia with the patient or authorized representative who has indicated his/her understanding and acceptance.       Plan Discussed with: CRNA  Anesthesia Plan Comments:        Anesthesia Quick Evaluation

## 2019-04-07 NOTE — Discharge Instructions (Signed)
No nasal blowing or heavy lifting. Keep head of bed elevated.  Post Anesthesia Home Care Instructions  Activity: Get plenty of rest for the remainder of the day. A responsible individual must stay with you for 24 hours following the procedure.  For the next 24 hours, DO NOT: -Drive a car -Paediatric nurse -Drink alcoholic beverages -Take any medication unless instructed by your physician -Make any legal decisions or sign important papers.  Meals: Start with liquid foods such as gelatin or soup. Progress to regular foods as tolerated. Avoid greasy, spicy, heavy foods. If nausea and/or vomiting occur, drink only clear liquids until the nausea and/or vomiting subsides. Call your physician if vomiting continues.  Special Instructions/Symptoms: Your throat may feel dry or sore from the anesthesia or the breathing tube placed in your throat during surgery. If this causes discomfort, gargle with warm salt water. The discomfort should disappear within 24 hours.  If you had a scopolamine patch placed behind your ear for the management of post- operative nausea and/or vomiting:  1. The medication in the patch is effective for 72 hours, after which it should be removed.  Wrap patch in a tissue and discard in the trash. Wash hands thoroughly with soap and water. 2. You may remove the patch earlier than 72 hours if you experience unpleasant side effects which may include dry mouth, dizziness or visual disturbances. 3. Avoid touching the patch. Wash your hands with soap and water after contact with the patch.

## 2019-04-07 NOTE — Op Note (Signed)
Operative Note   DATE OF OPERATION: 04/07/2019  LOCATION:  [place]   SURGICAL DIVISION: Plastic Surgery  PREOPERATIVE DIAGNOSES:  Nasal fracture  POSTOPERATIVE DIAGNOSES:  same  PROCEDURE:  Closed Nasal fracture reduction  SURGEON: Claire Sanger Dillingham, DO  ASSISTANT: Roetta Sessions, PA  ANESTHESIA:  General.   COMPLICATIONS: None.   INDICATIONS FOR PROCEDURE:  The patient, Yesenia Ramsey is a 85 y.o. female born on 04-17-1925, is here for treatment of a severe nasal fracture. MRN: [id number]  CONSENT:  Informed consent was obtained directly from the patient. Risks, benefits and alternatives were fully discussed. Specific risks including but not limited to bleeding, infection, hematoma, seroma, scarring, pain, infection, contracture, asymmetry, wound healing problems, and need for further surgery were all discussed. The patient did have an ample opportunity to have questions answered to satisfaction.   DESCRIPTION OF PROCEDURE:   The patient was taken to the operating room. SCDs were placed and IV antibiotics were given. The patient's operative site was prepped and draped in a sterile fashion. A time out was performed and all information was confirmed to be correct.  General anesthesia was administered.  The afrin soaked pledgets were placed in the nose.  The dorsum was injected with local.  After waiting several minutes for the vasoconstriction the speculum was placed in the nose and the fracture was reset on the left.  The nasal splint was placed.  The posterior pharynx was suctioned to remove the blood.  The patient tolerated the procedure well.  There were no complications. The patient was allowed to wake from anesthesia, extubated and taken to the recovery room in satisfactory condition.    The advanced practice practitioner (APP) assisted throughout the case.  The APP was essential in retraction and counter traction when needed to make the case  progress smoothly.  This retraction and assistance made it possible to see the tissue plans for the procedure.  The assistance was needed for blood control, tissue re-approximation and assisted with closure of the incision site.

## 2019-04-07 NOTE — Anesthesia Procedure Notes (Signed)
Procedure Name: LMA Insertion Date/Time: 04/07/2019 11:26 AM Performed by: Lavonia Dana, CRNA Pre-anesthesia Checklist: Patient identified, Emergency Drugs available, Suction available and Patient being monitored Patient Re-evaluated:Patient Re-evaluated prior to induction Oxygen Delivery Method: Circle system utilized Preoxygenation: Pre-oxygenation with 100% oxygen Induction Type: IV induction Ventilation: Mask ventilation without difficulty LMA: LMA inserted LMA Size: 3.0 Number of attempts: 1 Airway Equipment and Method: Bite block Placement Confirmation: positive ETCO2 Tube secured with: Tape Dental Injury: Teeth and Oropharynx as per pre-operative assessment

## 2019-04-08 ENCOUNTER — Encounter (HOSPITAL_BASED_OUTPATIENT_CLINIC_OR_DEPARTMENT_OTHER): Payer: Self-pay | Admitting: Plastic Surgery

## 2019-04-15 ENCOUNTER — Telehealth: Payer: Self-pay

## 2019-04-15 NOTE — Telephone Encounter (Addendum)
Unable to reach patient's son regarding the message below.  Called and spoke with the patient's daughter-in-law Venida Jarvis) and she stated that her husband was at work and unable to answer the phone.  Informed her of the message from the patient's son.  Informed her that Dr. Marla Roe said to leave the bandage/splint off, and there is nothing else needs to be done.  Sherri verbalized understanding and agreed.//AB/CMA

## 2019-04-15 NOTE — Telephone Encounter (Signed)
Received call from patient's son to inform us that Yesenia Ramsey's bandage/splint has fallen off her nose. She underwent surgery on 04/07/2019 for a nasal fracture. She is not in pain; however, he wanted to let us know if there is anything further they need to do.

## 2019-04-19 ENCOUNTER — Ambulatory Visit (INDEPENDENT_AMBULATORY_CARE_PROVIDER_SITE_OTHER): Payer: Medicare Other | Admitting: Surgical

## 2019-04-19 ENCOUNTER — Encounter: Payer: Self-pay | Admitting: Surgical

## 2019-04-19 ENCOUNTER — Other Ambulatory Visit: Payer: Self-pay

## 2019-04-19 VITALS — BP 106/60 | HR 80 | Temp 97.1°F | Ht 60.0 in | Wt 105.6 lb

## 2019-04-19 DIAGNOSIS — S022XXD Fracture of nasal bones, subsequent encounter for fracture with routine healing: Secondary | ICD-10-CM

## 2019-04-19 NOTE — Progress Notes (Signed)
Yesenia Ramsey is a 85 year old female here for follow-up after closed reduction nasal fracture on 04/07/2019 with Dr. Marla Roe.  Patient called on Friday to inform us that the [place] splint had fallen off of her nose.  She is here with her son today.  She reports that her breathing has significantly improved since surgery and she is doing well.  She does not have any noticeable bruising. She has no complaints at this time.  No fever, chills, nausea, vomiting. Her nose appears slightly crooked and deviated to the left, but this is improved since her reduction.     Follow-up as needed.

## 2019-08-08 ENCOUNTER — Ambulatory Visit: Payer: Medicare Other | Attending: Internal Medicine

## 2019-08-08 DIAGNOSIS — Z20822 Contact with and (suspected) exposure to covid-19: Secondary | ICD-10-CM

## 2019-08-10 LAB — NOVEL CORONAVIRUS, NAA: SARS-CoV-2, NAA: NOT DETECTED

## 2019-09-24 ENCOUNTER — Ambulatory Visit: Payer: Medicare Other | Attending: Internal Medicine

## 2019-09-24 DIAGNOSIS — Z23 Encounter for immunization: Secondary | ICD-10-CM | POA: Insufficient documentation

## 2019-09-24 NOTE — Progress Notes (Signed)
   Covid-19 Vaccination Clinic  Name:  Melda Mermelstein    MRN: [id number] DOB: 04/16/1925  09/24/2019  Ms. Cawley was observed post Covid-19 immunization for 15 minutes without incidence. She was provided with Vaccine Information Sheet and instruction to access the V-Safe system.   Ms. Westmoreland was instructed to call 911 with any severe reactions post vaccine: Marland Kitchen Difficulty breathing  . Swelling of your face and throat  . A fast heartbeat  . A bad rash all over your body  . Dizziness and weakness    Immunizations Administered    Name Date Dose VIS Date Route   Pfizer COVID-19 Vaccine 09/24/2019  9:43 AM 0.3 mL 07/22/2019 Intramuscular   Manufacturer: [employer], [employer]   Lot: [id number]   NDC: [address]-3

## 2019-10-16 ENCOUNTER — Ambulatory Visit: Payer: Medicare Other | Attending: Internal Medicine

## 2019-10-16 DIAGNOSIS — Z23 Encounter for immunization: Secondary | ICD-10-CM | POA: Insufficient documentation

## 2019-10-16 NOTE — Progress Notes (Signed)
   Covid-19 Vaccination Clinic  Name:  Keyarah Mcroy    MRN: [id number] DOB: [birth year]  10/16/2019  Ms. Lassalle was observed post Covid-19 immunization for 15 minutes without incident. She was provided with Vaccine Information Sheet and instruction to access the V-Safe system.   Ms. Olarte was instructed to call 911 with any severe reactions post vaccine: Marland Kitchen Difficulty breathing  . Swelling of face and throat  . A fast heartbeat  . A bad rash all over body  . Dizziness and weakness   Immunizations Administered    Name Date Dose VIS Date Route   Pfizer COVID-19 Vaccine 10/16/2019  2:25 PM 0.3 mL 07/22/2019 Intramuscular   Manufacturer: [employer], [employer]   Lot: [id number]   NDC: [address]-3

## 2020-09-16 IMAGING — CT CT CERVICAL SPINE WITHOUT CONTRAST
3 series · 12 of 33 positions shown, 14 images · non-contrast
Comparison: None.

CLINICAL DATA: Trip and fall at home onto concrete. Swelling and
ecchymosis to right eye.

Head trauma, intracranial venous injury suspected; Head trauma, CSF
leak suspected; C-spine trauma, high clinical risk (NEXUS/CCR)
EXAM:
CT HEAD WITHOUT CONTRAST
CT MAXILLOFACIAL WITHOUT CONTRAST
CT CERVICAL SPINE WITHOUT CONTRAST
TECHNIQUE: Multidetector CT imaging of the head, cervical spine, and
maxillofacial structures were performed using the standard protocol
without intravenous contrast. Multiplanar CT image reconstructions
of the cervical spine and maxillofacial structures were also
generated.

[Series 4: c_spine 2.0 st · axial · 0.31mm/px · z∈[-207,-99]mm · 4 of 78 slices shown, 5 images]
[im 12/78  soft-tissue]
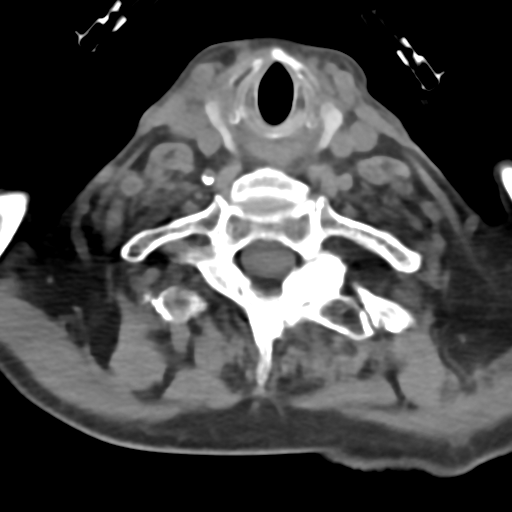
[im 12/78  bone]
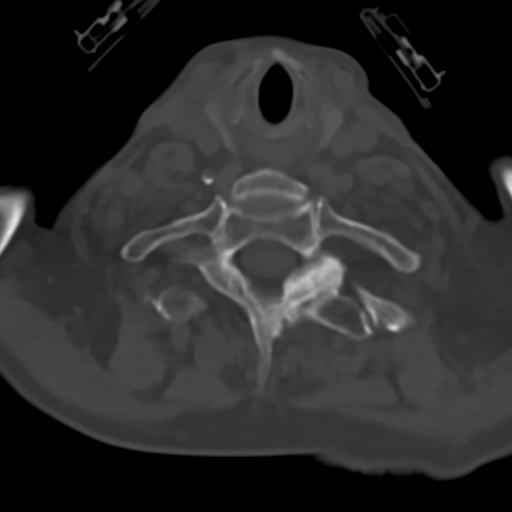
[im 30/78  bone]
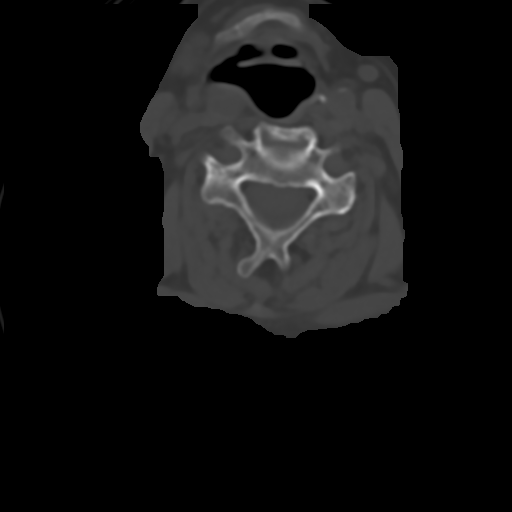
[im 48/78  bone]
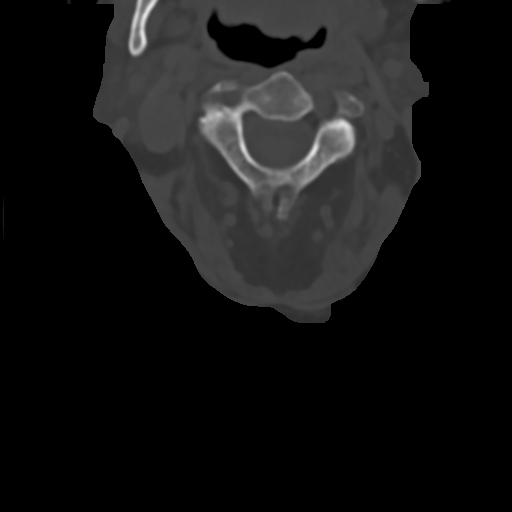
[im 66/78  bone]
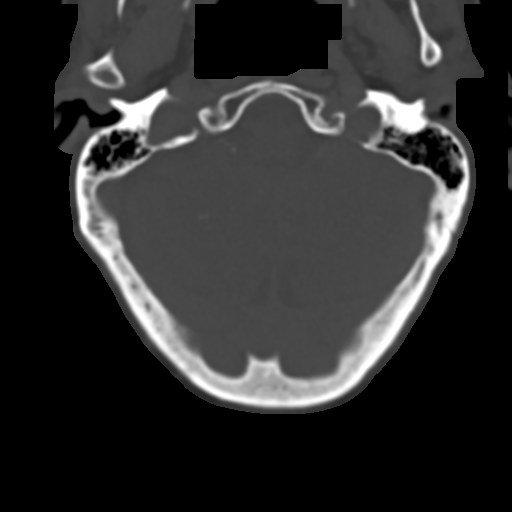

[Series 10: c_spine 2.0 sag bone · sagittal · 0.26mm/px · 5 of 61 slices shown, 6 images]
[im 21/61  bone]
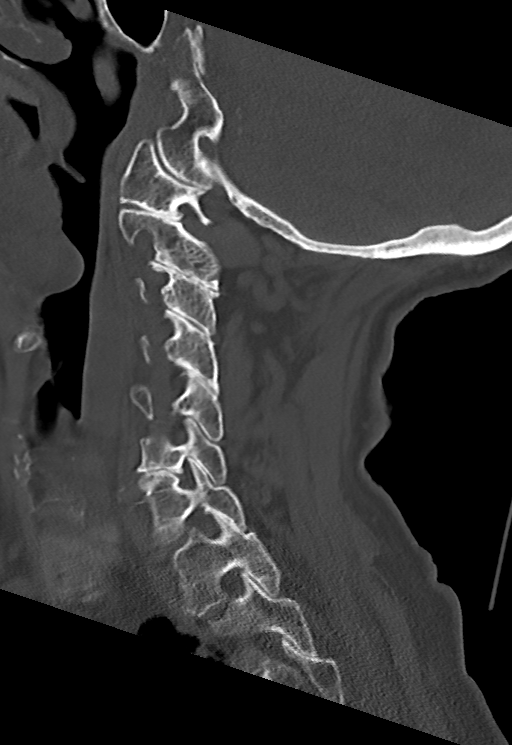
[im 26/61  bone]
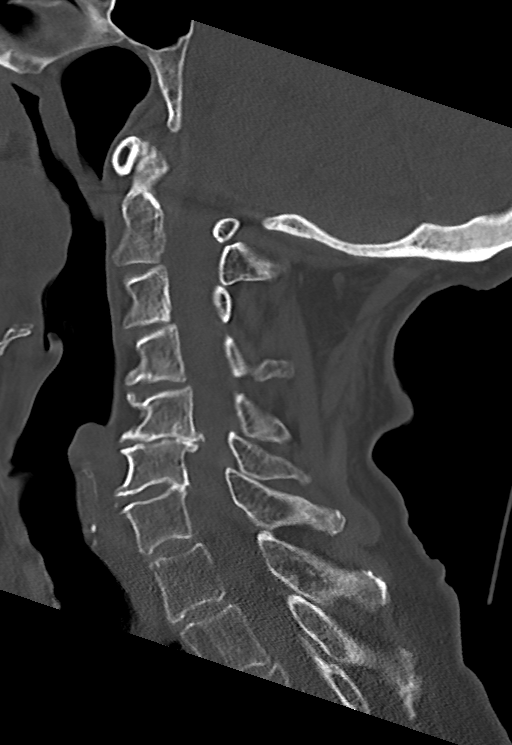
[im 31/61  soft-tissue]
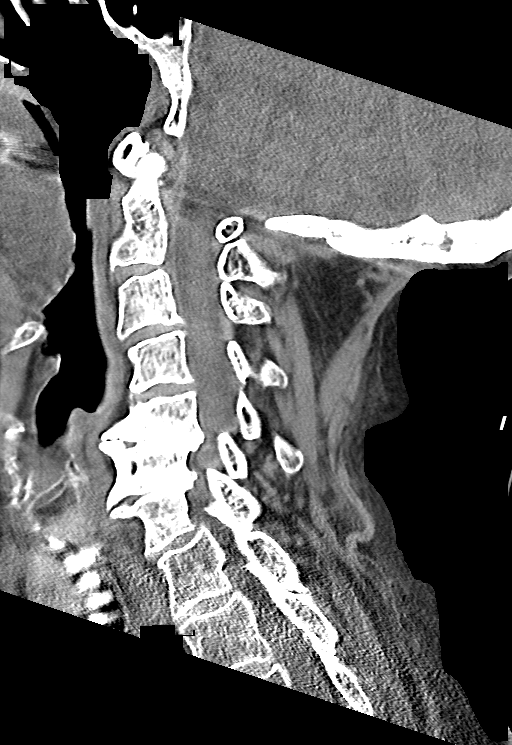
[im 31/61  bone]
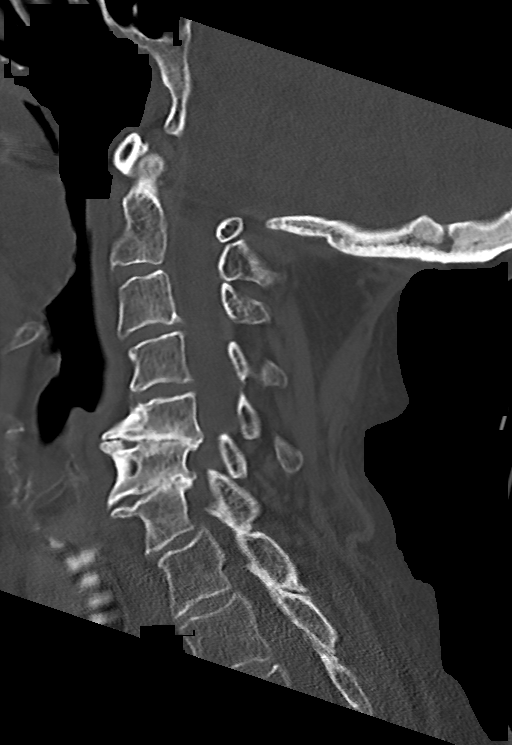
[im 36/61  bone]
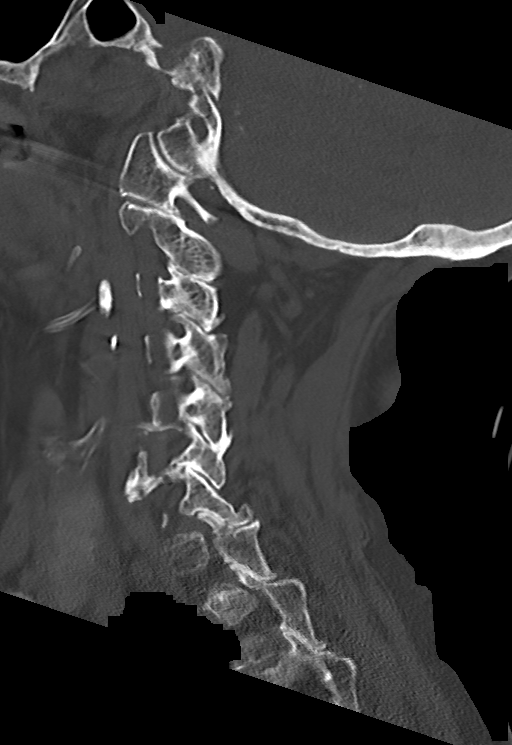
[im 41/61  bone]
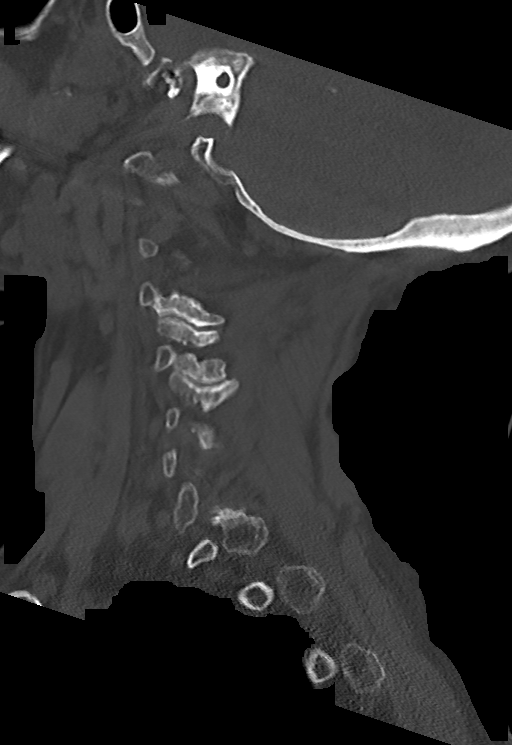

[Series 11: c_spine 2.0 cor bone · coronal · 0.23mm/px · 3 of 61 slices shown]
[im 13/61  bone]
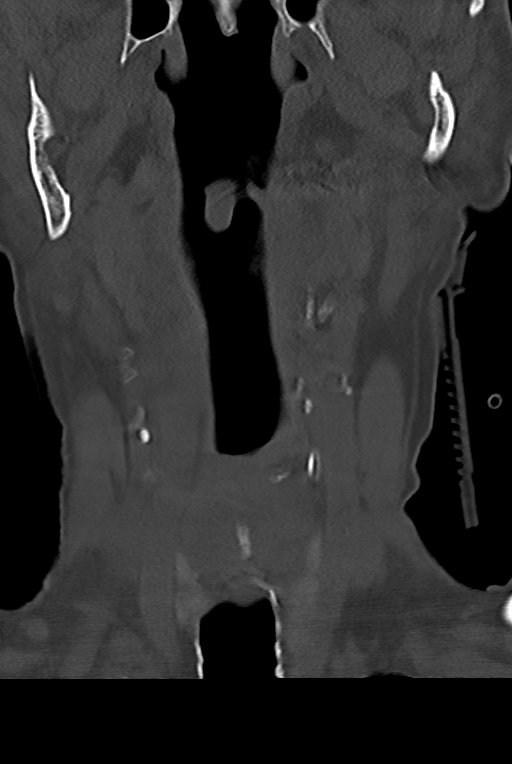
[im 25/61  bone]
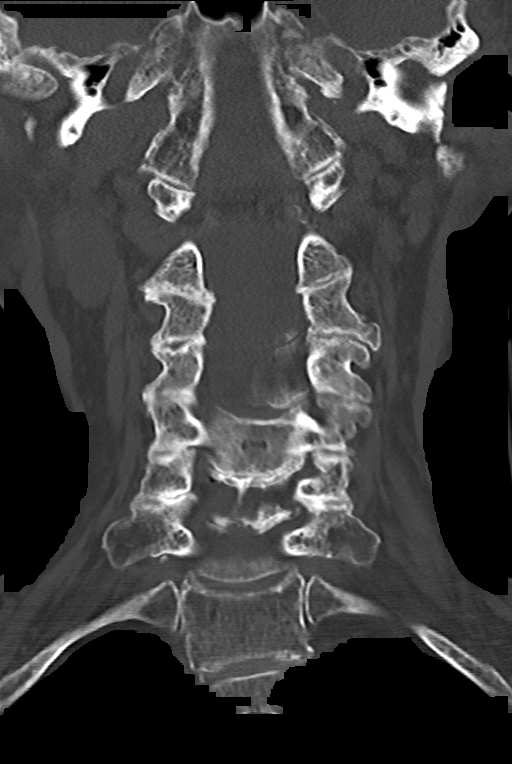
[im 37/61  bone]
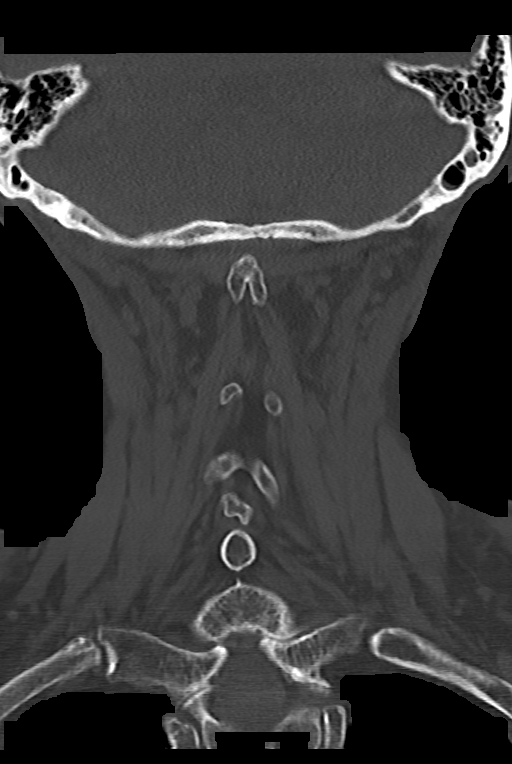

[12 of 33 positions shown; findings below may reference images not displayed]

FINDINGS: CT HEAD FINDINGS

Brain: No evidence of acute infarction, hemorrhage, hydrocephalus,
extra-axial collection or mass lesion/mass effect. Age related
atrophy. Moderate chronic small vessel ischemia. Remote lacunar
infarct in the left caudate/thalamus.

Vascular: Hyperdense vessel.

Skull: No fracture or focal lesion.

Other: None.

CT MAXILLOFACIAL FINDINGS

Osseous: Comminuted and depressed bilateral nasal bone fractures.
Slight rightward nasal septal deviation. Possible but not definite
and right anterior alveolar ridge fracture. Upper dentures in place.
Zygomatic arches are intact. Mandibles are intact, temporomandibular
joints are congruent with bilateral degenerative change.

Orbits: No acute orbital fracture. No globe injury. Bilateral lens
extraction. Right periorbital hematoma.

Sinuses: No sinus fracture or fluid level. Slight mucosal thickening
of ethmoid air cells. The mastoid air cells are clear.

Soft tissues: Right periorbital hematoma and edema.

CT CERVICAL SPINE FINDINGS

Alignment: No traumatic subluxation. Slight anterolisthesis of C4 on
C5 and retrolisthesis of C5 on C6 appears degenerative.

Skull base and vertebrae: No acute fracture. The dens and skull base
are intact. No focal bone abnormality.

Soft tissues and spinal canal: No prevertebral fluid or swelling. No
visible canal hematoma.

Disc levels: Disc space narrowing and endplate spurring is most
prominent at C5-C6. Multilevel facet hypertrophy.

Upper chest: Biapical pleuroparenchymal scarring. No acute findings.

Other: None.
IMPRESSION: 1. No acute intracranial abnormality. No skull fracture. Age related
atrophy and chronic small vessel ischemia.
2. Comminuted and depressed bilateral nasal bone fractures. Possible
but not definite right anterior alveolar ridge fracture.
3. Right periorbital hematoma and edema. No orbital fracture.
4. Degenerative change in the cervical spine without acute fracture
or subluxation.

## 2023-06-10 ENCOUNTER — Other Ambulatory Visit (HOSPITAL_BASED_OUTPATIENT_CLINIC_OR_DEPARTMENT_OTHER): Payer: Self-pay

## 2023-06-10 MED ORDER — BISOPROLOL FUMARATE 10 MG PO TABS
10.0000 mg | ORAL_TABLET | Freq: Every day | ORAL | 2 refills | Status: DC
  Filled 2023-08-31: qty 90, 90d supply, fill #0

## 2023-06-10 MED ORDER — FUROSEMIDE 40 MG PO TABS
40.0000 mg | ORAL_TABLET | Freq: Every day | ORAL | 3 refills | Status: DC
  Filled 2023-08-31: qty 90, 90d supply, fill #0

## 2023-07-10 ENCOUNTER — Other Ambulatory Visit (HOSPITAL_BASED_OUTPATIENT_CLINIC_OR_DEPARTMENT_OTHER): Payer: Self-pay

## 2023-07-13 ENCOUNTER — Other Ambulatory Visit (HOSPITAL_BASED_OUTPATIENT_CLINIC_OR_DEPARTMENT_OTHER): Payer: Self-pay

## 2023-07-13 MED ORDER — POTASSIUM CHLORIDE ER 10 MEQ PO CPCR
10.0000 meq | ORAL_CAPSULE | Freq: Every day | ORAL | 0 refills | Status: DC
  Filled 2023-07-13: qty 30, 30d supply, fill #0

## 2023-08-21 ENCOUNTER — Other Ambulatory Visit (HOSPITAL_BASED_OUTPATIENT_CLINIC_OR_DEPARTMENT_OTHER): Payer: Self-pay

## 2023-08-21 MED ORDER — POTASSIUM CHLORIDE ER 10 MEQ PO CPCR
10.0000 meq | ORAL_CAPSULE | Freq: Every day | ORAL | 0 refills | Status: DC
  Filled 2023-08-21: qty 30, 30d supply, fill #0

## 2023-08-31 ENCOUNTER — Other Ambulatory Visit (HOSPITAL_BASED_OUTPATIENT_CLINIC_OR_DEPARTMENT_OTHER): Payer: Self-pay

## 2023-08-31 ENCOUNTER — Other Ambulatory Visit: Payer: Self-pay

## 2023-09-03 ENCOUNTER — Other Ambulatory Visit (HOSPITAL_BASED_OUTPATIENT_CLINIC_OR_DEPARTMENT_OTHER): Payer: Self-pay

## 2023-09-21 ENCOUNTER — Other Ambulatory Visit (HOSPITAL_BASED_OUTPATIENT_CLINIC_OR_DEPARTMENT_OTHER): Payer: Self-pay

## 2023-09-22 ENCOUNTER — Other Ambulatory Visit: Payer: Self-pay

## 2023-09-22 ENCOUNTER — Other Ambulatory Visit (HOSPITAL_BASED_OUTPATIENT_CLINIC_OR_DEPARTMENT_OTHER): Payer: Self-pay

## 2023-09-22 MED ORDER — POTASSIUM CHLORIDE ER 10 MEQ PO CPCR
10.0000 meq | ORAL_CAPSULE | Freq: Every day | ORAL | 0 refills | Status: DC
  Filled 2023-09-22: qty 30, 30d supply, fill #0

## 2023-10-22 ENCOUNTER — Other Ambulatory Visit (HOSPITAL_BASED_OUTPATIENT_CLINIC_OR_DEPARTMENT_OTHER): Payer: Self-pay

## 2023-10-23 ENCOUNTER — Other Ambulatory Visit (HOSPITAL_BASED_OUTPATIENT_CLINIC_OR_DEPARTMENT_OTHER): Payer: Self-pay

## 2023-10-23 MED ORDER — POTASSIUM CHLORIDE ER 10 MEQ PO CPCR
10.0000 meq | ORAL_CAPSULE | Freq: Every day | ORAL | 0 refills | Status: DC
  Filled 2023-10-23: qty 30, 30d supply, fill #0

## 2023-10-28 ENCOUNTER — Emergency Department (HOSPITAL_COMMUNITY)

## 2023-10-28 ENCOUNTER — Encounter (HOSPITAL_COMMUNITY): Payer: Self-pay

## 2023-10-28 ENCOUNTER — Inpatient Hospital Stay (HOSPITAL_COMMUNITY)
Admission: EM | Admit: 2023-10-28 | Discharge: 2023-10-31 | DRG: 956 | Disposition: A | Discharge status: Hospice | Attending: Internal Medicine | Admitting: Internal Medicine

## 2023-10-28 ENCOUNTER — Other Ambulatory Visit: Payer: Self-pay

## 2023-10-28 DIAGNOSIS — R296 Repeated falls: Secondary | ICD-10-CM | POA: Diagnosis present

## 2023-10-28 DIAGNOSIS — M4854XA Collapsed vertebra, not elsewhere classified, thoracic region, initial encounter for fracture: Secondary | ICD-10-CM | POA: Diagnosis present

## 2023-10-28 DIAGNOSIS — J90 Pleural effusion, not elsewhere classified: Secondary | ICD-10-CM | POA: Diagnosis not present

## 2023-10-28 DIAGNOSIS — Z888 Allergy status to other drugs, medicaments and biological substances status: Secondary | ICD-10-CM

## 2023-10-28 DIAGNOSIS — F418 Other specified anxiety disorders: Secondary | ICD-10-CM | POA: Diagnosis not present

## 2023-10-28 DIAGNOSIS — E785 Hyperlipidemia, unspecified: Secondary | ICD-10-CM | POA: Diagnosis present

## 2023-10-28 DIAGNOSIS — N1832 Chronic kidney disease, stage 3b: Secondary | ICD-10-CM | POA: Diagnosis present

## 2023-10-28 DIAGNOSIS — S72001A Fracture of unspecified part of neck of right femur, initial encounter for closed fracture: Secondary | ICD-10-CM | POA: Diagnosis present

## 2023-10-28 DIAGNOSIS — Z66 Do not resuscitate: Secondary | ICD-10-CM | POA: Diagnosis present

## 2023-10-28 DIAGNOSIS — Z6820 Body mass index (BMI) 20.0-20.9, adult: Secondary | ICD-10-CM

## 2023-10-28 DIAGNOSIS — J9601 Acute respiratory failure with hypoxia: Secondary | ICD-10-CM | POA: Diagnosis not present

## 2023-10-28 DIAGNOSIS — Z23 Encounter for immunization: Secondary | ICD-10-CM | POA: Diagnosis present

## 2023-10-28 DIAGNOSIS — E8770 Fluid overload, unspecified: Secondary | ICD-10-CM | POA: Diagnosis not present

## 2023-10-28 DIAGNOSIS — Z880 Allergy status to penicillin: Secondary | ICD-10-CM

## 2023-10-28 DIAGNOSIS — H3530 Unspecified macular degeneration: Secondary | ICD-10-CM | POA: Diagnosis present

## 2023-10-28 DIAGNOSIS — G9349 Other encephalopathy: Secondary | ICD-10-CM | POA: Diagnosis not present

## 2023-10-28 DIAGNOSIS — Z882 Allergy status to sulfonamides status: Secondary | ICD-10-CM

## 2023-10-28 DIAGNOSIS — F32A Depression, unspecified: Secondary | ICD-10-CM | POA: Diagnosis present

## 2023-10-28 DIAGNOSIS — S72011A Unspecified intracapsular fracture of right femur, initial encounter for closed fracture: Principal | ICD-10-CM | POA: Diagnosis present

## 2023-10-28 DIAGNOSIS — D62 Acute posthemorrhagic anemia: Secondary | ICD-10-CM | POA: Diagnosis not present

## 2023-10-28 DIAGNOSIS — S36119A Unspecified injury of liver, initial encounter: Secondary | ICD-10-CM | POA: Diagnosis not present

## 2023-10-28 DIAGNOSIS — Y92009 Unspecified place in unspecified non-institutional (private) residence as the place of occurrence of the external cause: Secondary | ICD-10-CM | POA: Diagnosis not present

## 2023-10-28 DIAGNOSIS — E875 Hyperkalemia: Secondary | ICD-10-CM | POA: Diagnosis not present

## 2023-10-28 DIAGNOSIS — I10 Essential (primary) hypertension: Secondary | ICD-10-CM | POA: Diagnosis not present

## 2023-10-28 DIAGNOSIS — W19XXXA Unspecified fall, initial encounter: Principal | ICD-10-CM

## 2023-10-28 DIAGNOSIS — H9190 Unspecified hearing loss, unspecified ear: Secondary | ICD-10-CM | POA: Diagnosis present

## 2023-10-28 DIAGNOSIS — F0394 Unspecified dementia, unspecified severity, with anxiety: Secondary | ICD-10-CM | POA: Diagnosis present

## 2023-10-28 DIAGNOSIS — S0083XA Contusion of other part of head, initial encounter: Secondary | ICD-10-CM | POA: Diagnosis present

## 2023-10-28 DIAGNOSIS — Z79899 Other long term (current) drug therapy: Secondary | ICD-10-CM

## 2023-10-28 DIAGNOSIS — I9581 Postprocedural hypotension: Secondary | ICD-10-CM | POA: Diagnosis not present

## 2023-10-28 DIAGNOSIS — W010XXA Fall on same level from slipping, tripping and stumbling without subsequent striking against object, initial encounter: Secondary | ICD-10-CM | POA: Diagnosis present

## 2023-10-28 DIAGNOSIS — S0101XA Laceration without foreign body of scalp, initial encounter: Secondary | ICD-10-CM | POA: Diagnosis present

## 2023-10-28 DIAGNOSIS — D72829 Elevated white blood cell count, unspecified: Secondary | ICD-10-CM | POA: Diagnosis present

## 2023-10-28 DIAGNOSIS — E8779 Other fluid overload: Secondary | ICD-10-CM | POA: Diagnosis not present

## 2023-10-28 DIAGNOSIS — N179 Acute kidney failure, unspecified: Secondary | ICD-10-CM | POA: Diagnosis not present

## 2023-10-28 DIAGNOSIS — F0393 Unspecified dementia, unspecified severity, with mood disturbance: Secondary | ICD-10-CM | POA: Diagnosis present

## 2023-10-28 DIAGNOSIS — Z885 Allergy status to narcotic agent status: Secondary | ICD-10-CM

## 2023-10-28 DIAGNOSIS — Z886 Allergy status to analgesic agent status: Secondary | ICD-10-CM

## 2023-10-28 DIAGNOSIS — I129 Hypertensive chronic kidney disease with stage 1 through stage 4 chronic kidney disease, or unspecified chronic kidney disease: Secondary | ICD-10-CM | POA: Diagnosis present

## 2023-10-28 DIAGNOSIS — E43 Unspecified severe protein-calorie malnutrition: Secondary | ICD-10-CM | POA: Diagnosis present

## 2023-10-28 DIAGNOSIS — Z515 Encounter for palliative care: Secondary | ICD-10-CM | POA: Diagnosis not present

## 2023-10-28 DIAGNOSIS — R739 Hyperglycemia, unspecified: Secondary | ICD-10-CM | POA: Diagnosis present

## 2023-10-28 LAB — CBC
HCT: 37.7 % (ref 36.0–46.0)
Hemoglobin: 11.8 g/dL — ABNORMAL LOW (ref 12.0–15.0)
MCH: 29.9 pg (ref 26.0–34.0)
MCHC: 31.3 g/dL (ref 30.0–36.0)
MCV: 95.4 fL (ref 80.0–100.0)
Platelets: 282 10*3/uL (ref 150–400)
RBC: 3.95 MIL/uL (ref 3.87–5.11)
RDW: 13.6 % (ref 11.5–15.5)
WBC: 17.8 10*3/uL — ABNORMAL HIGH (ref 4.0–10.5)
nRBC: 0 % (ref 0.0–0.2)

## 2023-10-28 LAB — BASIC METABOLIC PANEL
Anion gap: 13 (ref 5–15)
BUN: 36 mg/dL — ABNORMAL HIGH (ref 8–23)
CO2: 24 mmol/L (ref 22–32)
Calcium: 8.5 mg/dL — ABNORMAL LOW (ref 8.9–10.3)
Chloride: 101 mmol/L (ref 98–111)
Creatinine, Ser: 1.38 mg/dL — ABNORMAL HIGH (ref 0.44–1.00)
GFR, Estimated: 35 mL/min — ABNORMAL LOW (ref >60)
Glucose, Bld: 161 mg/dL — ABNORMAL HIGH (ref 70–99)
Potassium: 4.1 mmol/L (ref 3.5–5.1)
Sodium: 138 mmol/L (ref 135–145)

## 2023-10-28 MED ORDER — LACTATED RINGERS IV SOLN: INTRAVENOUS | Status: AC

## 2023-10-28 MED ORDER — TETANUS-DIPHTH-ACELL PERTUSSIS 5-2.5-18.5 LF-MCG/0.5 IM SUSY
0.5000 mL | PREFILLED_SYRINGE | Freq: Once | INTRAMUSCULAR | Status: AC
  Administered 2023-10-28: 0.5 mL via INTRAMUSCULAR
  Filled 2023-10-28: qty 0.5

## 2023-10-28 MED ORDER — ACETAMINOPHEN 325 MG PO TABS: 650.0000 mg | ORAL_TABLET | Freq: Four times a day (QID) | ORAL | Status: DC | PRN

## 2023-10-28 MED ORDER — FENTANYL CITRATE PF 50 MCG/ML IJ SOSY
25.0000 ug | PREFILLED_SYRINGE | Freq: Once | INTRAMUSCULAR | Status: AC
  Administered 2023-10-28: 25 ug via INTRAVENOUS
  Filled 2023-10-28: qty 1

## 2023-10-28 MED ORDER — HYDROMORPHONE HCL 1 MG/ML IJ SOLN
0.5000 mg | INTRAMUSCULAR | Status: DC | PRN
  Administered 2023-10-29: 0.5 mg via INTRAVENOUS
  Filled 2023-10-28 (×2): qty 0.5

## 2023-10-28 MED ORDER — PROCHLORPERAZINE EDISYLATE 10 MG/2ML IJ SOLN
5.0000 mg | Freq: Four times a day (QID) | INTRAMUSCULAR | Status: DC | PRN
  Administered 2023-10-29: 5 mg via INTRAVENOUS
  Filled 2023-10-28: qty 2

## 2023-10-28 MED ORDER — POLYETHYLENE GLYCOL 3350 17 G PO PACK: 17.0000 g | PACK | Freq: Every day | ORAL | Status: DC | PRN

## 2023-10-28 MED ORDER — MELATONIN 5 MG PO TABS
5.0000 mg | ORAL_TABLET | Freq: Every evening | ORAL | Status: DC | PRN
Start: 2023-10-28 — End: 2023-10-31

## 2023-10-28 MED ORDER — OXYCODONE HCL 5 MG PO TABS: 5.0000 mg | ORAL_TABLET | Freq: Four times a day (QID) | ORAL | Status: DC | PRN

## 2023-10-28 NOTE — Progress Notes (Signed)
 Consult request received for displaced right femoral neck fracture.  Have discussed with the requesting MD patient's stability, pain control, and ongoing work up. I have reviewed x-rays and developed a provisional plan for hemiarthroplasty tomorrow pm.   Full consultation to follow in the am.    Myrene Galas, MD [facility], [facility] [phone]

## 2023-10-28 NOTE — H&P (Incomplete)
 History and Physical  Yesenia Ramsey BMW:[id number] DOB: Apr 01, 1925 DOA: 10/28/2023  Referring physician: Lurena Nida, PA-EDP  PCP: Merlene Laughter, MD (Inactive)  Outpatient Specialists: None Patient coming from: Home, lives with her son and daughter-in-law.  Chief Complaint: Fall  HPI: Yesenia Ramsey is a 89 y.o. female with medical history significant for advanced dementia, frequent falls, chronic anxiety/depression, CKD 3B, who presents to the ER from home after a mechanical fall earlier today.  The patient was getting out of her car and tripped.  No loss of consciousness.  Prior to this event had another fall 1 day before yesterday when she lost her balance.  EMS was activated and patient was brought into the ER for further evaluation.  In the ER, noted to have hematoma and laceration to right side of head.  Left eye is swollen shut from previous fall.  Imaging revealed right acute subcapital proximal right femoral neck fracture with superior displacement, mild angulation and internal rotation.  Moderate compression fracture of T5 vertebral body age-indeterminate.  EDP discussed the case with orthopedic surgery Dr. Carola Frost.  Possible surgical intervention on 10/29/2023 afternoon.  Per the patient's son at bedside, she had been increasedly more confused for the past 2 weeks.  ED Course: Temperature 97.4.  BP 90/57, pulse 90, respiratory 16, O2 saturation 100% on 2 L.  Lab studies notable for WBC 17.8.  Hemoglobin 11.8.  Serum glucose 161, creatinine 1.38, GFR 35.  Review of Systems: Review of systems as noted in the HPI. All other systems reviewed and are negative.   Past Medical History:  Diagnosis Date   Chronic kidney disease    on lasix    Dyspnea    Hyperlipidemia    Hypertension    Macular degeneration    Nasal fracture    Proximal humerus fracture    right   Past Surgical History:  Procedure Laterality Date   CHOLECYSTECTOMY     CLOSED REDUCTION NASAL FRACTURE N/A 04/07/2019    Procedure: CLOSED REDUCTION NASAL FRACTURE;  Surgeon: Peggye Form, DO;  Location: [facility];  Service: Plastics;  Laterality: N/A;   TONSILLECTOMY      Social History:  reports that she has never smoked. She has never used smokeless tobacco. She reports current alcohol use. She reports that she does not use drugs.   No Known Allergies  Family history: None reported.  Prior to Admission medications   Medication Sig Start Date End Date Taking? Authorizing Provider  acetaminophen (TYLENOL) 500 MG tablet Take 500 mg by mouth every 6 (six) hours as needed.    [provider]  atorvastatin (LIPITOR) 40 MG tablet Take 40 mg by mouth daily.    [provider]  bimatoprost (LUMIGAN) 0.03 % ophthalmic solution 1 drop at bedtime.    [provider]  bisoprolol (ZEBETA) 10 MG tablet Take 10 mg by mouth daily.    [provider]  bisoprolol (ZEBETA) 10 MG tablet Take 1 tablet (10 mg total) by mouth daily. 05/25/23     Coenzyme Q10 (COQ-10) 100 MG CAPS Take by mouth.    [provider]  cyanocobalamin 1000 MCG tablet Take 1,000 mcg by mouth daily.    [provider]  furosemide (LASIX) 40 MG tablet Take 40 mg by mouth daily.    [provider]  furosemide (LASIX) 40 MG tablet Take 1 tablet (40 mg total) by mouth daily. 02/28/23     HYDROcodone-acetaminophen (NORCO) 5-325 MG tablet Take 1 tablet by  mouth every 6 (six) hours as needed for moderate pain. 04/07/19   Dillingham, Alena Bills, DO  latanoprost (XALATAN) 0.005 % ophthalmic solution INSTILL ONE DROP IN BOTH EYES DAILY 02/24/19   [provider]  Multiple Vitamins-Minerals (ICAPS AREDS FORMULA PO) Take by mouth.    [provider]  potassium chloride (K-DUR) 10 MEQ tablet Take 10 mEq by mouth daily.    [provider]  potassium chloride (MICRO-K) 10 MEQ CR capsule Take 1 capsule (10 mEq total) by mouth daily. 10/23/23     RESTASIS 0.05 %  ophthalmic emulsion INSTILL ONE DROP IN BOTH EYES TWICE A DAY AS DIRECTED 03/02/19   [provider]  traMADol (ULTRAM) 50 MG tablet Take 1 tablet (50 mg total) by mouth every 6 (six) hours as needed. 03/24/19   Charlynne Pander, MD  Vitamin D, Cholecalciferol, 25 MCG (1000 UT) CAPS Take by mouth.    [provider]    Physical Exam: BP (!) 111/98   Pulse (!) 119   Temp 98.1 F (36.7 C) (Oral)   Resp (!) 25   SpO2 100%   General: 89 y.o. year-old female frail-appearing in no acute distress.  Alert and confused Cardiovascular: Regular rate and rhythm with no rubs or gallops.  No thyromegaly or JVD noted.  No lower extremity edema bilaterally. Respiratory: Clear to auscultation with no wheezes or rales.  Poor inspiratory effort. Abdomen: Soft nontender nondistended with normal bowel sounds x4 quadrants. Muskuloskeletal: No cyanosis, clubbing or edema noted bilaterally Neuro: CN II-XII intact, strength, sensation, reflexes Skin: Facial bruising from falls Psychiatry: Judgement and insight appear altered. Mood is appropriate for condition and setting          Labs on Admission:  Basic Metabolic Panel: No results for input(s): "NA", "K", "CL", "CO2", "GLUCOSE", "BUN", "CREATININE", "CALCIUM", "MG", "PHOS" in the last 168 hours. Liver Function Tests: No results for input(s): "AST", "ALT", "ALKPHOS", "BILITOT", "PROT", "ALBUMIN" in the last 168 hours. No results for input(s): "LIPASE", "AMYLASE" in the last 168 hours. No results for input(s): "AMMONIA" in the last 168 hours. CBC: Recent Labs  Lab 10/28/23 2126  WBC 17.8*  HGB 11.8*  HCT 37.7  MCV 95.4  PLT 282   Cardiac Enzymes: No results for input(s): "CKTOTAL", "CKMB", "CKMBINDEX", "TROPONINI" in the last 168 hours.  BNP (last 3 results) No results for input(s): "BNP" in the last 8760 hours.  ProBNP (last 3 results) No results for input(s): "PROBNP" in the last 8760 hours.  CBG: No results for  input(s): "GLUCAP" in the last 168 hours.  Radiological Exams on Admission: DG Knee Complete 4 Views Right Result Date: 10/28/2023 CLINICAL DATA:  Tripped, fell, right hip fracture, pain EXAM: RIGHT KNEE - COMPLETE 4+ VIEW COMPARISON:  None Available. FINDINGS: Frontal, bilateral oblique, and lateral views of the right knee are obtained. No acute fracture, subluxation, or dislocation. Mild 3 compartmental osteoarthritis greatest in the patellofemoral compartment. No joint effusion. Soft tissues are unremarkable. IMPRESSION: 1. No acute displaced fracture. 2. Mild 3 compartmental osteoarthritis, greatest in the patellofemoral compartment. Electronically Signed   By: Sharlet Salina M.D.   On: 10/28/2023 21:34   DG Hip Unilat W or Wo Pelvis 2-3 Views Right Result Date: 10/28/2023 CLINICAL DATA:  Fall  with right hip fracture. EXAM: DG HIP (WITH OR WITHOUT PELVIS) 2-3V RIGHT COMPARISON:  AP pelvis 03/24/2019. FINDINGS: Generalized osteopenia. AP pelvis without evidence of displaced pelvic fracture or diastasis. There is spurring of the SI joints and  pubic symphysis without widening. There is an acute subcapital proximal right femoral neck closed fracture. The distal fragment is superiorly displaced 2 cm with mild medial and ventral angulation and internal rotation. There is mild nonerosive arthrosis at both hips. Visualized proximal left femur is intact. Soft tissues are unremarkable apart from vascular calcifications. Pronounced lumbar levoscoliosis partially visible. IMPRESSION: 1. Acute subcapital proximal right femoral neck fracture with superior displacement, mild angulation and internal rotation. 2. Osteopenia and degenerative change. 3. Lumbar levoscoliosis partially visible. Electronically Signed   By: Almira Bar M.D.   On: 10/28/2023 20:21   CT Cervical Spine Wo Contrast Result Date: 10/28/2023 CLINICAL DATA:  Neck trauma (Age >= 65y).  Fall. EXAM: CT CERVICAL SPINE WITHOUT CONTRAST TECHNIQUE:  Multidetector CT imaging of the cervical spine was performed without intravenous contrast. Multiplanar CT image reconstructions were also generated. RADIATION DOSE REDUCTION: This exam was performed according to the departmental dose-optimization program which includes automated exposure control, adjustment of the mA and/or kV according to patient size and/or use of iterative reconstruction technique. COMPARISON:  03/24/2019 FINDINGS: Alignment: No subluxation Skull base and vertebrae: Moderate compression fracture involving the T5 vertebral body, age indeterminate. No visible additional fracture or focal bone lesion. Soft tissues and spinal canal: No prevertebral fluid or swelling. No visible canal hematoma. Disc levels: Degenerative disc disease most pronounced at C5-6 and C6-7. Mild-to-moderate degenerative facet disease bilaterally, left greater than right. Upper chest: No acute findings.  Biapical scarring. Other: None IMPRESSION: Degenerative disc and facet disease. Moderate compression fracture involving the T5 vertebral body, age indeterminate. Electronically Signed   By: Charlett Nose M.D.   On: 10/28/2023 20:06   CT Maxillofacial Wo Contrast Result Date: 10/28/2023 CLINICAL DATA:  Facial trauma, blunt.  Fall. EXAM: CT MAXILLOFACIAL WITHOUT CONTRAST TECHNIQUE: Multidetector CT imaging of the maxillofacial structures was performed. Multiplanar CT image reconstructions were also generated. RADIATION DOSE REDUCTION: This exam was performed according to the departmental dose-optimization program which includes automated exposure control, adjustment of the mA and/or kV according to patient size and/or use of iterative reconstruction technique. COMPARISON:  None Available. FINDINGS: Osseous: Image quality is motion degraded. No visible facial fracture. Zygomatic arches and mandible appear intact. Orbits: Negative. No traumatic or inflammatory finding. Sinuses: Clear Soft tissues: Soft tissue swelling in the  right forehead. Limited intracranial: See head CT report IMPRESSION: No facial or orbital fracture. Electronically Signed   By: Charlett Nose M.D.   On: 10/28/2023 20:03   CT Head Wo Contrast Result Date: 10/28/2023 CLINICAL DATA:  Head trauma, minor (Age >= 65y).  Fall. EXAM: CT HEAD WITHOUT CONTRAST TECHNIQUE: Contiguous axial images were obtained from the base of the skull through the vertex without intravenous contrast. RADIATION DOSE REDUCTION: This exam was performed according to the departmental dose-optimization program which includes automated exposure control, adjustment of the mA and/or kV according to patient size and/or use of iterative reconstruction technique. COMPARISON:  03/24/2019 FINDINGS: Brain: There is atrophy and chronic small vessel disease changes. No acute intracranial abnormality. Specifically, no hemorrhage, hydrocephalus, mass lesion, acute infarction, or significant intracranial injury. Vascular: No hyperdense vessel or unexpected calcification. Skull: No acute calvarial abnormality. Sinuses/Orbits: No acute findings Other: Soft tissue swelling in the right forehead. IMPRESSION: Atrophy, chronic microvascular disease. No acute intracranial abnormality. Electronically Signed   By: Charlett Nose M.D.   On: 10/28/2023 20:02    EKG: I independently viewed the EKG done and my findings are as followed: Sinus rhythm rate of 80.  Nonspecific  ST-T changes.  QTc 434.  Assessment/Plan Present on Admission:  Closed right hip fracture, initial encounter [facility])  Principal Problem:   Closed right hip fracture, initial encounter (HCC)  Right hip fracture, post mechanical fall As needed analgesics Orthopedic surgery, Dr. Carola Frost consulted by EDP Gentle IV fluid hydration N.p.o. after midnight until seen by orthopedic surgery Fall precautions in place.  Leukocytosis, possibly reactive in the setting of right hip fracture Rule out other causes, follow UA The patient is unable to provide  a history regarding lower urinary tract symptoms due to underlying advanced dementia.  Hyperglycemia Serum glucose 161 Obtain A1c  Chronic anxiety/depression Resume home regimen  Severe protein calorie malnutrition Severe muscle mass loss Encourage oral intake when no longer n.p.o.  CKD 3B, at baseline Gentle IV fluid hydration Monitor urine output   Time: 75 minutes.   DVT prophylaxis: SCDs-defer pharmacological DVT prophylaxis to orthopedic surgery.  Code Status: DNR  Family Communication: Updated the patient's son and daughter-in-law at bedside.  Disposition Plan: Admitted to telemetry surgical unit.  Consults called: Orthopedic surgery.  Admission status: Inpatient status.   Status is: Inpatient The patient requires at least 2 midnights for further evaluation and treatment of present condition.   Darlin Drop MD [facility] Pager [phone]  If 7PM-7AM, please contact night-coverage [email or web address] Password [facility]  10/28/2023, 10:32 PM

## 2023-10-28 NOTE — ED Triage Notes (Signed)
 Pt BIB EMS from home for a fall. Pt was getting out of car and tripped. No LOC, denies blood thinners. Hematoma and laceration to right side of head. Left eye swollen shut from previous fall. C/O right hip/deformity. Pt arrives in c-collar. EMS gave of fentanyl.

## 2023-10-28 NOTE — Progress Notes (Deleted)
[phone] 

## 2023-10-28 NOTE — ED Provider Notes (Signed)
 [place] EMERGENCY DEPARTMENT AT [facility] Provider Note   CSN: [id number] Arrival date & time: 10/28/23  1814     History {Add pertinent medical, surgical, social history, OB history to HPI:1} Chief Complaint  Patient presents with   Yesenia Ramsey is a 89 y.o. female.  Patient with history of CKD, hypertension, hyperlipidemia, dementia presents today with complaints of fall. Patient with significant dementia and therefore history provided predominantly by family at bedside.  Patient was apparently getting out of the car today and the patients daughter in law heard her hit the ground. She has a new hematoma to her forehead and pain with deformity to her right hip. She has not walked since the fall. She is not anticoagulated. She has significant dementia at baseline, gets around with assistance of her son and daughter in law who she lives with and who cares for her along with a home health aid that comes in the mornings. She apparently refuses to use a walker or cane despite regular falls. She is at her baseline mental status per family.  Level 5 caveat --- dementia   The history is provided by the patient. No language interpreter was used.  Fall       Home Medications Prior to Admission medications   Medication Sig Start Date End Date Taking? Authorizing Provider  acetaminophen (TYLENOL) 500 MG tablet Take 500 mg by mouth every 6 (six) hours as needed.    [provider]  atorvastatin (LIPITOR) 40 MG tablet Take 40 mg by mouth daily.    [provider]  bimatoprost (LUMIGAN) 0.03 % ophthalmic solution 1 drop at bedtime.    [provider]  bisoprolol (ZEBETA) 10 MG tablet Take 10 mg by mouth daily.    [provider]  bisoprolol (ZEBETA) 10 MG tablet Take 1 tablet (10 mg total) by mouth daily. 05/25/23     Coenzyme Q10 (COQ-10) 100 MG CAPS Take by mouth.    [provider]  cyanocobalamin 1000 MCG tablet Take 1,000  mcg by mouth daily.    [provider]  furosemide (LASIX) 40 MG tablet Take 40 mg by mouth daily.    [provider]  furosemide (LASIX) 40 MG tablet Take 1 tablet (40 mg total) by mouth daily. 02/28/23     HYDROcodone-acetaminophen (NORCO) 5-325 MG tablet Take 1 tablet by mouth every 6 (six) hours as needed for moderate pain. 04/07/19   Dillingham, Alena Bills, DO  latanoprost (XALATAN) 0.005 % ophthalmic solution INSTILL ONE DROP IN BOTH EYES DAILY 02/24/19   [provider]  Multiple Vitamins-Minerals (ICAPS AREDS FORMULA PO) Take by mouth.    [provider]  potassium chloride (K-DUR) 10 MEQ tablet Take 10 mEq by mouth daily.    [provider]  potassium chloride (MICRO-K) 10 MEQ CR capsule Take 1 capsule (10 mEq total) by mouth daily. 10/23/23     RESTASIS 0.05 % ophthalmic emulsion INSTILL ONE DROP IN BOTH EYES TWICE A DAY AS DIRECTED 03/02/19   [provider]  traMADol (ULTRAM) 50 MG tablet Take 1 tablet (50 mg total) by mouth every 6 (six) hours as needed. 03/24/19   Charlynne Pander, MD  Vitamin D, Cholecalciferol, 25 MCG (1000 UT) CAPS Take by mouth.    [provider]      Allergies    Patient has no known allergies.    Review of Systems   Review of Systems  Unable to perform  ROS: Dementia    Physical Exam Updated Vital Signs BP (!) 146/55 (BP Location: Right Arm)   Pulse 77   Temp 98.1 F (36.7 C) (Oral)   Resp 19   SpO2 96%  Physical Exam Vitals and nursing note reviewed.  Constitutional:      General: She is not in acute distress.    Appearance: Normal appearance. She is normal weight. She is not ill-appearing, toxic-appearing or diaphoretic.  HENT:     Head: Normocephalic and atraumatic.     Comments: Hematoma to the right forehead with small superficial wound Cardiovascular:     Rate and Rhythm: Normal rate.  Pulmonary:     Effort: Pulmonary effort is normal. No respiratory distress.  Musculoskeletal:         General: Normal range of motion.     Cervical back: Normal range of motion.  Skin:    General: Skin is warm and dry.  Neurological:     General: No focal deficit present.     Mental Status: She is alert.  Psychiatric:        Mood and Affect: Mood normal.        Behavior: Behavior normal.     ED Results / Procedures / Treatments   Labs (all labs ordered are listed, but only abnormal results are displayed) Labs Reviewed  CBC  BASIC METABOLIC PANEL    EKG EKG Interpretation Date/Time:  Wednesday October 28 2023 18:20:39 EDT Ventricular Rate:  80 PR Interval:  160 QRS Duration:  72 QT Interval:  378 QTC Calculation: 434 R Axis:   63  Text Interpretation: Sinus rhythm Borderline T abnormalities, lateral leads Confirmed by Alvester Chou [phone]) on 10/28/2023 7:01:43 PM  Radiology No results found.  Procedures Procedures  {Document cardiac monitor, telemetry assessment procedure when appropriate:1}  Medications Ordered in ED Medications  Tdap (BOOSTRIX) injection 0.5 mL (has no administration in time range)    ED Course/ Medical Decision Making/ A&P Clinical Course as of 10/28/23 2055  Wed Oct 28, 2023  2054 This is a 89 year old female with a history of advanced dementia presenting from home, where she lives with her son, with concern for a fall and hip pain.  Patient is a poor historian and a level 5 caveat due to dementia.  Her family members at the bedside say that typically she can walk with assistance and a cane but not much.  Patient's workup today included CT imaging of the head that she does have a contusion and small laceration of the scalp where she struck her head, with no emergent findings of the face and skull.  She is noted on her CT cervical spine as a potential age-indeterminate L5 compression fracture, which does not appear to correlate with any pinpoint tenderness on exam, nor complaints were family in the past of back pain.  I suspect this is likely  chronic.  X-ray of the hip is concerning for a femoral neck fracture, which is a closed injury.  Patient will be given pain medicine and will require hospitalization.  Will need orthopedic consult.  Her son at the bedside who is her next of kin and medical decision maker confirms that the patient is DNR/DNI. [MT]    Clinical Course User Index [MT] Trifan, Kermit Balo, MD   {   Click here for ABCD2, HEART and other calculatorsREFRESH Note before signing :1}  Medical Decision Making Amount and/or Complexity of Data Reviewed Labs: ordered. Radiology: ordered.  Risk Prescription drug management.   ***  {Document critical care time when appropriate:1} {Document review of labs and clinical decision tools ie heart score, Chads2Vasc2 etc:1}  {Document your independent review of radiology images, and any outside records:1} {Document your discussion with family members, caretakers, and with consultants:1} {Document social determinants of health affecting pt's care:1} {Document your decision making why or why not admission, treatments were needed:1} Final Clinical Impression(s) / ED Diagnoses Final diagnoses:  None    Rx / DC Orders ED Discharge Orders     None

## 2023-10-28 NOTE — ED Notes (Signed)
 Patient transported to CT

## 2023-10-29 ENCOUNTER — Other Ambulatory Visit (HOSPITAL_COMMUNITY): Payer: Self-pay

## 2023-10-29 ENCOUNTER — Encounter (HOSPITAL_COMMUNITY): Payer: Self-pay | Admitting: Internal Medicine

## 2023-10-29 ENCOUNTER — Encounter (HOSPITAL_COMMUNITY)
Admission: EM | Disposition: A | Discharge status: Hospice | Payer: Self-pay | Source: Home / Self Care | Attending: Internal Medicine

## 2023-10-29 ENCOUNTER — Inpatient Hospital Stay (HOSPITAL_COMMUNITY): Admitting: Certified Registered"

## 2023-10-29 ENCOUNTER — Inpatient Hospital Stay (HOSPITAL_COMMUNITY)

## 2023-10-29 ENCOUNTER — Other Ambulatory Visit: Payer: Self-pay

## 2023-10-29 DIAGNOSIS — F418 Other specified anxiety disorders: Secondary | ICD-10-CM

## 2023-10-29 DIAGNOSIS — I10 Essential (primary) hypertension: Secondary | ICD-10-CM

## 2023-10-29 DIAGNOSIS — S72001A Fracture of unspecified part of neck of right femur, initial encounter for closed fracture: Secondary | ICD-10-CM | POA: Diagnosis not present

## 2023-10-29 HISTORY — PX: HIP ARTHROPLASTY: SHX981

## 2023-10-29 LAB — URINALYSIS, ROUTINE W REFLEX MICROSCOPIC
Bilirubin Urine: NEGATIVE
Glucose, UA: NEGATIVE mg/dL
Hgb urine dipstick: NEGATIVE
Ketones, ur: NEGATIVE mg/dL
Leukocytes,Ua: NEGATIVE
Nitrite: NEGATIVE
Protein, ur: 30 mg/dL — AB
Specific Gravity, Urine: 1.012 (ref 1.005–1.030)
pH: 5 (ref 5.0–8.0)

## 2023-10-29 LAB — HEMOGLOBIN A1C
Hgb A1c MFr Bld: 5.6 % (ref 4.8–5.6)
Mean Plasma Glucose: 114.02 mg/dL

## 2023-10-29 LAB — SURGICAL PCR SCREEN
MRSA, PCR: NEGATIVE
Staphylococcus aureus: POSITIVE — AB

## 2023-10-29 LAB — CBC
HCT: 37.8 % (ref 36.0–46.0)
Hemoglobin: 11.8 g/dL — ABNORMAL LOW (ref 12.0–15.0)
MCH: 29.8 pg (ref 26.0–34.0)
MCHC: 31.2 g/dL (ref 30.0–36.0)
MCV: 95.5 fL (ref 80.0–100.0)
Platelets: 244 10*3/uL (ref 150–400)
RBC: 3.96 MIL/uL (ref 3.87–5.11)
RDW: 13.8 % (ref 11.5–15.5)
WBC: 16.3 10*3/uL — ABNORMAL HIGH (ref 4.0–10.5)
nRBC: 0 % (ref 0.0–0.2)

## 2023-10-29 LAB — BASIC METABOLIC PANEL
Anion gap: 18 — ABNORMAL HIGH (ref 5–15)
BUN: 43 mg/dL — ABNORMAL HIGH (ref 8–23)
CO2: 19 mmol/L — ABNORMAL LOW (ref 22–32)
Calcium: 8.9 mg/dL (ref 8.9–10.3)
Chloride: 101 mmol/L (ref 98–111)
Creatinine, Ser: 2.1 mg/dL — ABNORMAL HIGH (ref 0.44–1.00)
GFR, Estimated: 21 mL/min — ABNORMAL LOW (ref >60)
Glucose, Bld: 213 mg/dL — ABNORMAL HIGH (ref 70–99)
Potassium: 5 mmol/L (ref 3.5–5.1)
Sodium: 138 mmol/L (ref 135–145)

## 2023-10-29 LAB — PHOSPHORUS: Phosphorus: 6.7 mg/dL — ABNORMAL HIGH (ref 2.5–4.6)

## 2023-10-29 LAB — GLUCOSE, CAPILLARY: Glucose-Capillary: 96 mg/dL (ref 70–99)

## 2023-10-29 LAB — MAGNESIUM: Magnesium: 2.4 mg/dL (ref 1.7–2.4)

## 2023-10-29 LAB — VITAMIN D 25 HYDROXY (VIT D DEFICIENCY, FRACTURES): Vit D, 25-Hydroxy: 69.93 ng/mL (ref 30–100)

## 2023-10-29 SURGERY — HEMIARTHROPLASTY, HIP, DIRECT ANTERIOR APPROACH, FOR FRACTURE
Anesthesia: General | Site: Hip | Laterality: Right

## 2023-10-29 MED ORDER — 0.9 % SODIUM CHLORIDE (POUR BTL) OPTIME
TOPICAL | Status: DC | PRN
  Administered 2023-10-29: 1000 mL

## 2023-10-29 MED ORDER — PROPOFOL 10 MG/ML IV BOLUS
INTRAVENOUS | Status: DC | PRN
  Administered 2023-10-29: 50 mg via INTRAVENOUS
  Administered 2023-10-29: 20 mg via INTRAVENOUS

## 2023-10-29 MED ORDER — ASPIRIN 81 MG PO TBEC: 81.0000 mg | DELAYED_RELEASE_TABLET | Freq: Every day | ORAL | Status: DC

## 2023-10-29 MED ORDER — CHLORHEXIDINE GLUCONATE 4 % EX SOLN
1.0000 | CUTANEOUS | 1 refills | Status: DC
  Filled 2023-10-29: qty 946, 42d supply, fill #0

## 2023-10-29 MED ORDER — PHENYLEPHRINE 80 MCG/ML (10ML) SYRINGE FOR IV PUSH (FOR BLOOD PRESSURE SUPPORT)
PREFILLED_SYRINGE | INTRAVENOUS | Status: DC | PRN
  Administered 2023-10-29 (×2): 160 ug via INTRAVENOUS

## 2023-10-29 MED ORDER — SODIUM CHLORIDE 0.9 % IV BOLUS
500.0000 mL | Freq: Once | INTRAVENOUS | Status: AC
  Administered 2023-10-29: 500 mL via INTRAVENOUS

## 2023-10-29 MED ORDER — LIDOCAINE 2% (20 MG/ML) 5 ML SYRINGE
INTRAMUSCULAR | Status: DC | PRN
  Administered 2023-10-29: 20 mg via INTRAVENOUS

## 2023-10-29 MED ORDER — FENTANYL CITRATE (PF) 100 MCG/2ML IJ SOLN
25.0000 ug | INTRAMUSCULAR | Status: DC | PRN
Start: 2023-10-29 — End: 2023-10-29

## 2023-10-29 MED ORDER — ALBUMIN HUMAN 5 % IV SOLN
25.0000 g | Freq: Once | INTRAVENOUS | Status: AC
  Administered 2023-10-30: 25 g via INTRAVENOUS
  Filled 2023-10-29: qty 500

## 2023-10-29 MED ORDER — CHLORHEXIDINE GLUCONATE 0.12 % MT SOLN
OROMUCOSAL | Status: AC
Start: 2023-10-29 — End: 2023-10-29
  Administered 2023-10-29: 15 mL via OROMUCOSAL
  Filled 2023-10-29: qty 15

## 2023-10-29 MED ORDER — SUGAMMADEX SODIUM 200 MG/2ML IV SOLN
INTRAVENOUS | Status: DC | PRN
  Administered 2023-10-29: 200 mg via INTRAVENOUS

## 2023-10-29 MED ORDER — VANCOMYCIN HCL 1000 MG IV SOLR
INTRAVENOUS | Status: AC
  Filled 2023-10-29: qty 20

## 2023-10-29 MED ORDER — DOCUSATE SODIUM 100 MG PO CAPS
100.0000 mg | ORAL_CAPSULE | Freq: Two times a day (BID) | ORAL | Status: DC
  Filled 2023-10-29: qty 1

## 2023-10-29 MED ORDER — CYCLOSPORINE 0.05 % OP EMUL
1.0000 [drp] | Freq: Two times a day (BID) | OPHTHALMIC | Status: DC
  Administered 2023-10-29 – 2023-10-30 (×3): 1 [drp] via OPHTHALMIC
  Filled 2023-10-29 (×5): qty 30

## 2023-10-29 MED ORDER — TRANEXAMIC ACID-NACL 1000-0.7 MG/100ML-% IV SOLN
1000.0000 mg | INTRAVENOUS | Status: AC
  Administered 2023-10-29: 1000 mg via INTRAVENOUS
  Filled 2023-10-29: qty 100

## 2023-10-29 MED ORDER — ALBUMIN HUMAN 5 % IV SOLN
12.5000 g | Freq: Once | INTRAVENOUS | Status: AC
  Administered 2023-10-29: 12.5 g via INTRAVENOUS

## 2023-10-29 MED ORDER — ONDANSETRON HCL 4 MG/2ML IJ SOLN
INTRAMUSCULAR | Status: DC | PRN
  Administered 2023-10-29: 4 mg via INTRAVENOUS

## 2023-10-29 MED ORDER — ORAL CARE MOUTH RINSE: 15.0000 mL | Freq: Once | OROMUCOSAL | Status: AC

## 2023-10-29 MED ORDER — CEFAZOLIN SODIUM-DEXTROSE 2-4 GM/100ML-% IV SOLN
2.0000 g | INTRAVENOUS | Status: AC
  Administered 2023-10-29: 2 g via INTRAVENOUS
  Filled 2023-10-29: qty 100

## 2023-10-29 MED ORDER — MIDODRINE HCL 5 MG PO TABS: 5.0000 mg | ORAL_TABLET | ORAL | Status: AC

## 2023-10-29 MED ORDER — VANCOMYCIN HCL 1000 MG IV SOLR
INTRAVENOUS | Status: DC | PRN
  Administered 2023-10-29: 1 g

## 2023-10-29 MED ORDER — SODIUM CHLORIDE 0.9 % IV SOLN
INTRAVENOUS | Status: DC | PRN
Start: 2023-10-29 — End: 2023-10-29

## 2023-10-29 MED ORDER — VASOPRESSIN 20 UNIT/ML IV SOLN
INTRAVENOUS | Status: AC
  Filled 2023-10-29: qty 1

## 2023-10-29 MED ORDER — EPINEPHRINE HCL 5 MG/250ML IV SOLN IN NS
INTRAVENOUS | Status: DC | PRN
  Administered 2023-10-29: 10 ug/min via INTRAVENOUS

## 2023-10-29 MED ORDER — FENTANYL CITRATE (PF) 250 MCG/5ML IJ SOLN
INTRAMUSCULAR | Status: DC | PRN
Start: 2023-10-29 — End: 2023-10-29
  Administered 2023-10-29: 50 ug via INTRAVENOUS
  Administered 2023-10-29: 25 ug via INTRAVENOUS

## 2023-10-29 MED ORDER — LORAZEPAM 0.5 MG PO TABS: 0.5000 mg | ORAL_TABLET | Freq: Every day | ORAL | Status: DC

## 2023-10-29 MED ORDER — MIDODRINE HCL 5 MG PO TABS
2.5000 mg | ORAL_TABLET | Freq: Three times a day (TID) | ORAL | Status: DC
  Administered 2023-10-29: 2.5 mg via ORAL
  Filled 2023-10-29: qty 1

## 2023-10-29 MED ORDER — PHENYLEPHRINE HCL-NACL 20-0.9 MG/250ML-% IV SOLN
INTRAVENOUS | Status: DC | PRN
  Administered 2023-10-29: 30 ug/min via INTRAVENOUS

## 2023-10-29 MED ORDER — SODIUM CHLORIDE 0.9 % IR SOLN
Status: DC | PRN
Start: 2023-10-29 — End: 2023-10-29
  Administered 2023-10-29: 3000 mL

## 2023-10-29 MED ORDER — PROPOFOL 10 MG/ML IV BOLUS
INTRAVENOUS | Status: AC
  Filled 2023-10-29: qty 20

## 2023-10-29 MED ORDER — SERTRALINE HCL 25 MG PO TABS: 25.0000 mg | ORAL_TABLET | Freq: Every day | ORAL | Status: DC

## 2023-10-29 MED ORDER — EPINEPHRINE 1 MG/10ML IJ SOSY
PREFILLED_SYRINGE | INTRAMUSCULAR | Status: DC | PRN
  Administered 2023-10-29: 25 ug via INTRAVENOUS
  Administered 2023-10-29: 10 ug via INTRAVENOUS

## 2023-10-29 MED ORDER — ALBUMIN HUMAN 5 % IV SOLN: INTRAVENOUS | Status: DC | PRN

## 2023-10-29 MED ORDER — CHLORHEXIDINE GLUCONATE 0.12 % MT SOLN: 15.0000 mL | Freq: Once | OROMUCOSAL | Status: AC

## 2023-10-29 MED ORDER — VASOPRESSIN 20 UNIT/ML IV SOLN
INTRAVENOUS | Status: DC | PRN
  Administered 2023-10-29 (×2): 1 [IU] via INTRAVENOUS

## 2023-10-29 MED ORDER — MUPIROCIN 2 % EX OINT
1.0000 | TOPICAL_OINTMENT | Freq: Two times a day (BID) | CUTANEOUS | 0 refills | Status: DC
  Filled 2023-10-29: qty 44, 44d supply, fill #0

## 2023-10-29 MED ORDER — SODIUM CHLORIDE 0.9 % IV SOLN: INTRAVENOUS | Status: DC

## 2023-10-29 MED ORDER — ROCURONIUM BROMIDE 10 MG/ML (PF) SYRINGE
PREFILLED_SYRINGE | INTRAVENOUS | Status: DC | PRN
  Administered 2023-10-29: 5 mg via INTRAVENOUS
  Administered 2023-10-29: 35 mg via INTRAVENOUS

## 2023-10-29 MED ORDER — POVIDONE-IODINE 10 % EX SWAB
2.0000 | Freq: Once | CUTANEOUS | Status: AC
  Administered 2023-10-29: 2 via TOPICAL

## 2023-10-29 MED ORDER — CHLORHEXIDINE GLUCONATE 4 % EX SOLN: 60.0000 mL | Freq: Once | CUTANEOUS | Status: DC

## 2023-10-29 MED ORDER — PROPOFOL 10 MG/ML IV BOLUS
INTRAVENOUS | Status: AC
Start: 2023-10-29 — End: ?
  Filled 2023-10-29: qty 20

## 2023-10-29 MED ORDER — FENTANYL CITRATE (PF) 250 MCG/5ML IJ SOLN
INTRAMUSCULAR | Status: AC
  Filled 2023-10-29: qty 5

## 2023-10-29 MED ORDER — ONDANSETRON HCL 4 MG/2ML IJ SOLN: 4.0000 mg | Freq: Once | INTRAMUSCULAR | Status: DC | PRN

## 2023-10-29 MED ORDER — CEFAZOLIN SODIUM-DEXTROSE 2-4 GM/100ML-% IV SOLN
2.0000 g | Freq: Three times a day (TID) | INTRAVENOUS | Status: AC
  Administered 2023-10-29 – 2023-10-30 (×3): 2 g via INTRAVENOUS
  Filled 2023-10-29 (×3): qty 100

## 2023-10-29 MED ORDER — ALBUMIN HUMAN 5 % IV SOLN
INTRAVENOUS | Status: AC
Start: 2023-10-29 — End: 2023-10-30
  Filled 2023-10-29: qty 250

## 2023-10-29 SURGICAL SUPPLY — 43 items
BAG COUNTER SPONGE SURGICOUNT (BAG) ×1 IMPLANT
BLADE SAW SGTL 18X1.27X75 (BLADE) ×1 IMPLANT
BNDG COHESIVE 6X5 TAN ST LF (GAUZE/BANDAGES/DRESSINGS) ×1 IMPLANT
CHLORAPREP W/TINT 26 (MISCELLANEOUS) ×1 IMPLANT
COVER PERINEAL POST (MISCELLANEOUS) ×1 IMPLANT
COVER SURGICAL LIGHT HANDLE (MISCELLANEOUS) ×1 IMPLANT
DERMABOND ADVANCED .7 DNX12 (GAUZE/BANDAGES/DRESSINGS) IMPLANT
DRAPE HALF SHEET 40X57 (DRAPES) ×2 IMPLANT
DRAPE IMP U-DRAPE 54X76 (DRAPES) ×2 IMPLANT
DRAPE INCISE IOBAN 85X60 (DRAPES) ×1 IMPLANT
DRAPE STERI IOBAN 125X83 (DRAPES) ×1 IMPLANT
DRAPE SURG 17X23 STRL (DRAPES) ×1 IMPLANT
DRAPE SURG ORHT 6 SPLT 77X108 (DRAPES) IMPLANT
DRAPE U-SHAPE 47X51 STRL (DRAPES) IMPLANT
DRSG AQUACEL AG ADV 3.5X10 (GAUZE/BANDAGES/DRESSINGS) IMPLANT
DRSG MEPILEX POST OP 4X8 (GAUZE/BANDAGES/DRESSINGS) ×1 IMPLANT
ELECT BLADE 6.5 EXT (BLADE) IMPLANT
ELECT CAUTERY BLADE 6.4 (BLADE) IMPLANT
ELECT REM PT RETURN 9FT ADLT (ELECTROSURGICAL) ×1 IMPLANT
ELECTRODE REM PT RTRN 9FT ADLT (ELECTROSURGICAL) ×1 IMPLANT
GLOVE BIO SURGEON STRL SZ 6.5 (GLOVE) ×3 IMPLANT
GLOVE BIO SURGEON STRL SZ7.5 (GLOVE) ×3 IMPLANT
GLOVE BIOGEL PI IND STRL 6.5 (GLOVE) ×1 IMPLANT
GLOVE BIOGEL PI IND STRL 7.5 (GLOVE) ×1 IMPLANT
GOWN STRL REUS W/ TWL LRG LVL3 (GOWN DISPOSABLE) ×2 IMPLANT
HEAD MOD COCR 28MM HD -3MM NK (Orthopedic Implant) IMPLANT
KIT BASIN OR (CUSTOM PROCEDURE TRAY) ×1 IMPLANT
KIT TURNOVER KIT B (KITS) ×1 IMPLANT
MANIFOLD NEPTUNE II (INSTRUMENTS) ×1 IMPLANT
NS IRRIG 1000ML POUR BTL (IV SOLUTION) ×1 IMPLANT
PACK TOTAL JOINT (CUSTOM PROCEDURE TRAY) ×1 IMPLANT
PAD ARMBOARD POSITIONER FOAM (MISCELLANEOUS) ×2 IMPLANT
RINGBLOC BI POLAR 28X46MM (Orthopedic Implant) ×1 IMPLANT
SET HNDPC FAN SPRY TIP SCT (DISPOSABLE) IMPLANT
SHELL RINGBLOC BI POLR 28X46MM (Orthopedic Implant) IMPLANT
STAPLER VISISTAT 35W (STAPLE) ×1 IMPLANT
STEM FEM CMTLS HO 9X137 123D (Stem) IMPLANT
SUT ETHIBOND NAB CT1 #1 30IN (SUTURE) ×1 IMPLANT
SUT MNCRL AB 3-0 PS2 18 (SUTURE) IMPLANT
SUT MON AB 2-0 SH 27 (SUTURE) ×1 IMPLANT
SUT VIC AB 1 CT1 27XBRD ANBCTR (SUTURE) ×1 IMPLANT
TOWEL GREEN STERILE (TOWEL DISPOSABLE) ×1 IMPLANT
WATER STERILE IRR 1000ML POUR (IV SOLUTION) ×1 IMPLANT

## 2023-10-29 NOTE — H&P (View-Only) (Signed)
 Reason for Consult:Right hip fx Referring Physician: Pamella Pert Time called: 0730 Time at bedside: 0847   Yesenia Ramsey is an 89 y.o. female.  HPI: [name] fell at home as she was getting out of a car and tripped. She was brought to the ED where x-rays showed a right hip fx and orthopedic surgery was consulted. She has advanced dementia and cannot contribute to history.  Past Medical History:  Diagnosis Date   Chronic kidney disease    on lasix    Dyspnea    Hyperlipidemia    Hypertension    Macular degeneration    Nasal fracture    Proximal humerus fracture    right    Past Surgical History:  Procedure Laterality Date   CHOLECYSTECTOMY     CLOSED REDUCTION NASAL FRACTURE N/A 04/07/2019   Procedure: CLOSED REDUCTION NASAL FRACTURE;  Surgeon: Peggye Form, DO;  Location:  SUR[facility]ERY [facility];  Service: Plastics;  Laterality: N/A;   TONSILLECTOMY      History reviewed. No pertinent family history.  Social History:  reports that she has never smoked. She has never used smokeless tobacco. She reports current alcohol use. She reports that she does not use drugs.  Allergies:  Allergies  Allergen Reactions   Cat Dander Cough and Shortness Of Breath   Amlodipine Other (See Comments)   Codeine Other (See Comments)   Penicillins Other (See Comments)   Risedronate Other (See Comments)   Sulfa Antibiotics Other (See Comments)    Medications: I have reviewed the patient's current medications.  Results for orders placed or performed during the hospital encounter of 10/28/23 (from the past 48 hours)  CBC     Status: Abnormal   Collection Time: 10/28/23  9:26 PM  Result Value Ref Range   WBC 17.8 (H) 4.0 - 10.5 K/uL   RBC 3.95 3.87 - 5.11 MIL/uL   Hemoglobin 11.8 (L) 12.0 - 15.0 g/dL   HCT 95.6 38.7 - 56.4 %   MCV 95.4 80.0 - 100.0 fL   MCH 29.9 26.0 - 34.0 pg   MCHC 31.3 30.0 - 36.0 g/dL   RDW 33.2 95.1 - 88.4 %   Platelets 282 150 - 400 K/uL   nRBC 0.0 0.0 -  0.2 %    Comment: Performed at [facility], [address] [place], [place] [address]  Basic metabolic panel     Status: Abnormal   Collection Time: 10/28/23  9:26 PM  Result Value Ref Range   Sodium 138 135 - 145 mmol/L   Potassium 4.1 3.5 - 5.1 mmol/L   Chloride 101 98 - 111 mmol/L   CO2 24 22 - 32 mmol/L   Glucose, Bld 161 (H) 70 - 99 mg/dL    Comment: Glucose reference range applies only to samples taken after fasting for at least 8 hours.   BUN 36 (H) 8 - 23 mg/dL   Creatinine, Ser 3.01 (H) 0.44 - 1.00 mg/dL   Calcium 8.5 (L) 8.9 - 10.3 mg/dL   GFR, Estimated 35 (L) >60 mL/min    Comment: (NOTE) Calculated using the CKD-EPI Creatinine Equation (2021)    Anion gap 13 5 - 15    Comment: Performed at [facility], [address] [place], [place] [address]  ABO/Rh     Status: None   Collection Time: 10/28/23  9:26 PM  Result Value Ref Range   ABO/RH(D)      O POS Performed at [facility],  [address], [place], [place] [address]   Type and screen [facility]     Status: None   Collection Time: 10/29/23  6:24 AM  Result Value Ref Range   ABO/RH(D) O POS    Antibody Screen NEG    Sample Expiration      11/01/2023,2359 Performed at [facility], [address], [place], [place] [address]   CBC     Status: Abnormal   Collection Time: 10/29/23  6:30 AM  Result Value Ref Range   WBC 16.3 (H) 4.0 - 10.5 K/uL   RBC 3.96 3.87 - 5.11 MIL/uL   Hemoglobin 11.8 (L) 12.0 - 15.0 g/dL   HCT 82.9 56.2 - 13.0 %   MCV 95.5 80.0 - 100.0 fL   MCH 29.8 26.0 - 34.0 pg   MCHC 31.2 30.0 - 36.0 g/dL   RDW 86.5 78.4 - 69.6 %   Platelets 244 150 - 400 K/uL   nRBC 0.0 0.0 - 0.2 %    Comment: Performed at [facility], [address] [place], [place] [address]  Basic metabolic panel     Status: Abnormal   Collection Time: 10/29/23  6:30 AM  Result Value Ref Range   Sodium 138 135 - 145 mmol/L   Potassium 5.0 3.5 - 5.1 mmol/L   Chloride  101 98 - 111 mmol/L   CO2 19 (L) 22 - 32 mmol/L   Glucose, Bld 213 (H) 70 - 99 mg/dL    Comment: Glucose reference range applies only to samples taken after fasting for at least 8 hours.   BUN 43 (H) 8 - 23 mg/dL   Creatinine, Ser 4.13 (H) 0.44 - 1.00 mg/dL   Calcium 8.9 8.9 - 24.4 mg/dL   GFR, Estimated 21 (L) >60 mL/min    Comment: (NOTE) Calculated using the CKD-EPI Creatinine Equation (2021)    Anion gap 18 (H) 5 - 15    Comment: Performed at [facility], [address], [place], [place] [address]  Magnesium     Status: None   Collection Time: 10/29/23  6:30 AM  Result Value Ref Range   Magnesium 2.4 1.7 - 2.4 mg/dL    Comment: Performed at [facility], [address], [place], [place] [address]  Phosphorus     Status: Abnormal   Collection Time: 10/29/23  6:30 AM  Result Value Ref Range   Phosphorus 6.7 (H) 2.5 - 4.6 mg/dL    Comment: Performed at [facility], [address], [place], [place] [address]    DG Knee Complete 4 Views Right Result Date: 10/28/2023 CLINICAL DATA:  Tripped, fell, right hip fracture, pain EXAM: RIGHT KNEE - COMPLETE 4+ VIEW COMPARISON:  None Available. FINDINGS: Frontal, bilateral oblique, and lateral views of the right knee are obtained. No acute fracture, subluxation, or dislocation. Mild 3 compartmental osteoarthritis greatest in the patellofemoral compartment. No joint effusion. Soft tissues are unremarkable. IMPRESSION: 1. No acute displaced fracture. 2. Mild 3 compartmental osteoarthritis, greatest in the patellofemoral compartment. Electronically Signed   By: Sharlet Salina M.D.   On: 10/28/2023 21:34   DG Hip Unilat W or Wo Pelvis 2-3 Views Right Result Date: 10/28/2023 CLINICAL DATA:  Fall  with right hip fracture. EXAM: DG HIP (WITH OR WITHOUT PELVIS) 2-3V RIGHT COMPARISON:  AP pelvis 03/24/2019. FINDINGS: Generalized osteopenia. AP pelvis without evidence of displaced pelvic fracture or diastasis. There is spurring of the SI  joints  and pubic symphysis without widening. There is an acute subcapital proximal right femoral neck closed fracture. The distal fragment is superiorly displaced 2 cm with mild medial and ventral angulation and internal rotation. There is mild nonerosive arthrosis at both hips. Visualized proximal left femur is intact. Soft tissues are unremarkable apart from vascular calcifications. Pronounced lumbar levoscoliosis partially visible. IMPRESSION: 1. Acute subcapital proximal right femoral neck fracture with superior displacement, mild angulation and internal rotation. 2. Osteopenia and degenerative change. 3. Lumbar levoscoliosis partially visible. Electronically Signed   By: Almira Bar M.D.   On: 10/28/2023 20:21   CT Cervical Spine Wo Contrast Result Date: 10/28/2023 CLINICAL DATA:  Neck trauma (Age >= 65y).  Fall. EXAM: CT CERVICAL SPINE WITHOUT CONTRAST TECHNIQUE: Multidetector CT imaging of the cervical spine was performed without intravenous contrast. Multiplanar CT image reconstructions were also generated. RADIATION DOSE REDUCTION: This exam was performed according to the departmental dose-optimization program which includes automated exposure control, adjustment of the mA and/or kV according to patient size and/or use of iterative reconstruction technique. COMPARISON:  03/24/2019 FINDINGS: Alignment: No subluxation Skull base and vertebrae: Moderate compression fracture involving the T5 vertebral body, age indeterminate. No visible additional fracture or focal bone lesion. Soft tissues and spinal canal: No prevertebral fluid or swelling. No visible canal hematoma. Disc levels: Degenerative disc disease most pronounced at C5-6 and C6-7. Mild-to-moderate degenerative facet disease bilaterally, left greater than right. Upper chest: No acute findings.  Biapical scarring. Other: None IMPRESSION: Degenerative disc and facet disease. Moderate compression fracture involving the T5 vertebral body, age  indeterminate. Electronically Signed   By: Charlett Nose M.D.   On: 10/28/2023 20:06   CT Maxillofacial Wo Contrast Result Date: 10/28/2023 CLINICAL DATA:  Facial trauma, blunt.  Fall. EXAM: CT MAXILLOFACIAL WITHOUT CONTRAST TECHNIQUE: Multidetector CT imaging of the maxillofacial structures was performed. Multiplanar CT image reconstructions were also generated. RADIATION DOSE REDUCTION: This exam was performed according to the departmental dose-optimization program which includes automated exposure control, adjustment of the mA and/or kV according to patient size and/or use of iterative reconstruction technique. COMPARISON:  None Available. FINDINGS: Osseous: Image quality is motion degraded. No visible facial fracture. Zygomatic arches and mandible appear intact. Orbits: Negative. No traumatic or inflammatory finding. Sinuses: Clear Soft tissues: Soft tissue swelling in the right forehead. Limited intracranial: See head CT report IMPRESSION: No facial or orbital fracture. Electronically Signed   By: Charlett Nose M.D.   On: 10/28/2023 20:03   CT Head Wo Contrast Result Date: 10/28/2023 CLINICAL DATA:  Head trauma, minor (Age >= 65y).  Fall. EXAM: CT HEAD WITHOUT CONTRAST TECHNIQUE: Contiguous axial images were obtained from the base of the skull through the vertex without intravenous contrast. RADIATION DOSE REDUCTION: This exam was performed according to the departmental dose-optimization program which includes automated exposure control, adjustment of the mA and/or kV according to patient size and/or use of iterative reconstruction technique. COMPARISON:  03/24/2019 FINDINGS: Brain: There is atrophy and chronic small vessel disease changes. No acute intracranial abnormality. Specifically, no hemorrhage, hydrocephalus, mass lesion, acute infarction, or significant intracranial injury. Vascular: No hyperdense vessel or unexpected calcification. Skull: No acute calvarial abnormality. Sinuses/Orbits: No acute  findings Other: Soft tissue swelling in the right forehead. IMPRESSION: Atrophy, chronic microvascular disease. No acute intracranial abnormality. Electronically Signed   By: Charlett Nose M.D.   On: 10/28/2023 20:02    Review of Systems  Unable to perform ROS: Dementia   Blood pressure (!) 87/49, pulse 85, temperature 97.8  F (36.6 C), temperature source Axillary, resp. rate 18, SpO2 94%. Physical Exam Constitutional:      General: She is not in acute distress.    Appearance: She is well-developed. She is not diaphoretic.  HENT:     Head: Normocephalic and atraumatic.  Eyes:     General: No scleral icterus.       Right eye: No discharge.        Left eye: No discharge.     Conjunctiva/sclera: Conjunctivae normal.  Cardiovascular:     Rate and Rhythm: Normal rate and regular rhythm.  Pulmonary:     Effort: Pulmonary effort is normal. No respiratory distress.  Musculoskeletal:     Cervical back: Normal range of motion.     Comments: RLE No traumatic wounds, ecchymosis, or rash  Mild TTP hip  No knee or ankle effusion  Knee stable to varus/ valgus and anterior/posterior stress  Sens DPN, SPN, TN could not assess  Motor EHL, ext, flex, evers grossly intact  DP 2+, PT 2+, No significant edema  Skin:    General: Skin is warm and dry.  Neurological:     Mental Status: She is alert.  Psychiatric:        Mood and Affect: Mood normal.        Behavior: Behavior normal.     Assessment/Plan: Right hip fx -- Plan hip hemi today with Dr. Jena Gauss. Please keep NPO. Multiple medical problems including advanced dementia, frequent falls, chronic anxiety/depression, and CKD 3B -- per primary service    Freeman Caldron, PA-C Orthopedic Surgery [phone] 10/29/2023, 8:51 AM

## 2023-10-29 NOTE — Consult Note (Signed)
 Reason for Consult:Right hip fx Referring Physician: Pamella Pert Time called: 0730 Time at bedside: 0847   Yesenia Ramsey is an 89 y.o. female.  HPI: Yesenia Ramsey fell at home as she was getting out of a car and tripped. She was brought to the ED where x-rays showed a right hip fx and orthopedic surgery was consulted. She has advanced dementia and cannot contribute to history.  Past Medical History:  Diagnosis Date   Chronic kidney disease    on lasix    Dyspnea    Hyperlipidemia    Hypertension    Macular degeneration    Nasal fracture    Proximal humerus fracture    right    Past Surgical History:  Procedure Laterality Date   CHOLECYSTECTOMY     CLOSED REDUCTION NASAL FRACTURE N/A 04/07/2019   Procedure: CLOSED REDUCTION NASAL FRACTURE;  Surgeon: Peggye Form, DO;  Location:  SURGERY CENTER;  Service: Plastics;  Laterality: N/A;   TONSILLECTOMY      History reviewed. No pertinent family history.  Social History:  reports that she has never smoked. She has never used smokeless tobacco. She reports current alcohol use. She reports that she does not use drugs.  Allergies:  Allergies  Allergen Reactions   Cat Dander Cough and Shortness Of Breath   Amlodipine Other (See Comments)   Codeine Other (See Comments)   Penicillins Other (See Comments)   Risedronate Other (See Comments)   Sulfa Antibiotics Other (See Comments)    Medications: I have reviewed the patient's current medications.  Results for orders placed or performed during the hospital encounter of 10/28/23 (from the past 48 hours)  CBC     Status: Abnormal   Collection Time: 10/28/23  9:26 PM  Result Value Ref Range   WBC 17.8 (H) 4.0 - 10.5 K/uL   RBC 3.95 3.87 - 5.11 MIL/uL   Hemoglobin 11.8 (L) 12.0 - 15.0 g/dL   HCT 95.6 38.7 - 56.4 %   MCV 95.4 80.0 - 100.0 fL   MCH 29.9 26.0 - 34.0 pg   MCHC 31.3 30.0 - 36.0 g/dL   RDW 33.2 95.1 - 88.4 %   Platelets 282 150 - 400 K/uL   nRBC 0.0 0.0 -  0.2 %    Comment: Performed at [facility], [address] [place], [place] [address]  Basic metabolic panel     Status: Abnormal   Collection Time: 10/28/23  9:26 PM  Result Value Ref Range   Sodium 138 135 - 145 mmol/L   Potassium 4.1 3.5 - 5.1 mmol/L   Chloride 101 98 - 111 mmol/L   CO2 24 22 - 32 mmol/L   Glucose, Bld 161 (H) 70 - 99 mg/dL    Comment: Glucose reference range applies only to samples taken after fasting for at least 8 hours.   BUN 36 (H) 8 - 23 mg/dL   Creatinine, Ser 3.01 (H) 0.44 - 1.00 mg/dL   Calcium 8.5 (L) 8.9 - 10.3 mg/dL   GFR, Estimated 35 (L) >60 mL/min    Comment: (NOTE) Calculated using the CKD-EPI Creatinine Equation (2021)    Anion gap 13 5 - 15    Comment: Performed at [facility], [address] [place], [place] [address]  ABO/Rh     Status: None   Collection Time: 10/28/23  9:26 PM  Result Value Ref Range   ABO/RH(D)      O POS Performed at [facility],  [address], [place], [place] [address]   Type and screen [facility]     Status: None   Collection Time: 10/29/23  6:24 AM  Result Value Ref Range   ABO/RH(D) O POS    Antibody Screen NEG    Sample Expiration      11/01/2023,2359 Performed at [facility], [address], [place], [place] [address]   CBC     Status: Abnormal   Collection Time: 10/29/23  6:30 AM  Result Value Ref Range   WBC 16.3 (H) 4.0 - 10.5 K/uL   RBC 3.96 3.87 - 5.11 MIL/uL   Hemoglobin 11.8 (L) 12.0 - 15.0 g/dL   HCT 82.9 56.2 - 13.0 %   MCV 95.5 80.0 - 100.0 fL   MCH 29.8 26.0 - 34.0 pg   MCHC 31.2 30.0 - 36.0 g/dL   RDW 86.5 78.4 - 69.6 %   Platelets 244 150 - 400 K/uL   nRBC 0.0 0.0 - 0.2 %    Comment: Performed at [facility], [address] [place], [place] [address]  Basic metabolic panel     Status: Abnormal   Collection Time: 10/29/23  6:30 AM  Result Value Ref Range   Sodium 138 135 - 145 mmol/L   Potassium 5.0 3.5 - 5.1 mmol/L   Chloride  101 98 - 111 mmol/L   CO2 19 (L) 22 - 32 mmol/L   Glucose, Bld 213 (H) 70 - 99 mg/dL    Comment: Glucose reference range applies only to samples taken after fasting for at least 8 hours.   BUN 43 (H) 8 - 23 mg/dL   Creatinine, Ser 4.13 (H) 0.44 - 1.00 mg/dL   Calcium 8.9 8.9 - 24.4 mg/dL   GFR, Estimated 21 (L) >60 mL/min    Comment: (NOTE) Calculated using the CKD-EPI Creatinine Equation (2021)    Anion gap 18 (H) 5 - 15    Comment: Performed at [facility], [address], [place], [place] [address]  Magnesium     Status: None   Collection Time: 10/29/23  6:30 AM  Result Value Ref Range   Magnesium 2.4 1.7 - 2.4 mg/dL    Comment: Performed at [facility], [address], [place], [place] [address]  Phosphorus     Status: Abnormal   Collection Time: 10/29/23  6:30 AM  Result Value Ref Range   Phosphorus 6.7 (H) 2.5 - 4.6 mg/dL    Comment: Performed at [facility], [address], [place], [place] [address]    DG Knee Complete 4 Views Right Result Date: 10/28/2023 CLINICAL DATA:  Tripped, fell, right hip fracture, pain EXAM: RIGHT KNEE - COMPLETE 4+ VIEW COMPARISON:  None Available. FINDINGS: Frontal, bilateral oblique, and lateral views of the right knee are obtained. No acute fracture, subluxation, or dislocation. Mild 3 compartmental osteoarthritis greatest in the patellofemoral compartment. No joint effusion. Soft tissues are unremarkable. IMPRESSION: 1. No acute displaced fracture. 2. Mild 3 compartmental osteoarthritis, greatest in the patellofemoral compartment. Electronically Signed   By: Sharlet Salina M.D.   On: 10/28/2023 21:34   DG Hip Unilat W or Wo Pelvis 2-3 Views Right Result Date: 10/28/2023 CLINICAL DATA:  Fall  with right hip fracture. EXAM: DG HIP (WITH OR WITHOUT PELVIS) 2-3V RIGHT COMPARISON:  AP pelvis 03/24/2019. FINDINGS: Generalized osteopenia. AP pelvis without evidence of displaced pelvic fracture or diastasis. There is spurring of the SI  joints  and pubic symphysis without widening. There is an acute subcapital proximal right femoral neck closed fracture. The distal fragment is superiorly displaced 2 cm with mild medial and ventral angulation and internal rotation. There is mild nonerosive arthrosis at both hips. Visualized proximal left femur is intact. Soft tissues are unremarkable apart from vascular calcifications. Pronounced lumbar levoscoliosis partially visible. IMPRESSION: 1. Acute subcapital proximal right femoral neck fracture with superior displacement, mild angulation and internal rotation. 2. Osteopenia and degenerative change. 3. Lumbar levoscoliosis partially visible. Electronically Signed   By: Almira Bar M.D.   On: 10/28/2023 20:21   CT Cervical Spine Wo Contrast Result Date: 10/28/2023 CLINICAL DATA:  Neck trauma (Age >= 65y).  Fall. EXAM: CT CERVICAL SPINE WITHOUT CONTRAST TECHNIQUE: Multidetector CT imaging of the cervical spine was performed without intravenous contrast. Multiplanar CT image reconstructions were also generated. RADIATION DOSE REDUCTION: This exam was performed according to the departmental dose-optimization program which includes automated exposure control, adjustment of the mA and/or kV according to patient size and/or use of iterative reconstruction technique. COMPARISON:  03/24/2019 FINDINGS: Alignment: No subluxation Skull base and vertebrae: Moderate compression fracture involving the T5 vertebral body, age indeterminate. No visible additional fracture or focal bone lesion. Soft tissues and spinal canal: No prevertebral fluid or swelling. No visible canal hematoma. Disc levels: Degenerative disc disease most pronounced at C5-6 and C6-7. Mild-to-moderate degenerative facet disease bilaterally, left greater than right. Upper chest: No acute findings.  Biapical scarring. Other: None IMPRESSION: Degenerative disc and facet disease. Moderate compression fracture involving the T5 vertebral body, age  indeterminate. Electronically Signed   By: Charlett Nose M.D.   On: 10/28/2023 20:06   CT Maxillofacial Wo Contrast Result Date: 10/28/2023 CLINICAL DATA:  Facial trauma, blunt.  Fall. EXAM: CT MAXILLOFACIAL WITHOUT CONTRAST TECHNIQUE: Multidetector CT imaging of the maxillofacial structures was performed. Multiplanar CT image reconstructions were also generated. RADIATION DOSE REDUCTION: This exam was performed according to the departmental dose-optimization program which includes automated exposure control, adjustment of the mA and/or kV according to patient size and/or use of iterative reconstruction technique. COMPARISON:  None Available. FINDINGS: Osseous: Image quality is motion degraded. No visible facial fracture. Zygomatic arches and mandible appear intact. Orbits: Negative. No traumatic or inflammatory finding. Sinuses: Clear Soft tissues: Soft tissue swelling in the right forehead. Limited intracranial: See head CT report IMPRESSION: No facial or orbital fracture. Electronically Signed   By: Charlett Nose M.D.   On: 10/28/2023 20:03   CT Head Wo Contrast Result Date: 10/28/2023 CLINICAL DATA:  Head trauma, minor (Age >= 65y).  Fall. EXAM: CT HEAD WITHOUT CONTRAST TECHNIQUE: Contiguous axial images were obtained from the base of the skull through the vertex without intravenous contrast. RADIATION DOSE REDUCTION: This exam was performed according to the departmental dose-optimization program which includes automated exposure control, adjustment of the mA and/or kV according to patient size and/or use of iterative reconstruction technique. COMPARISON:  03/24/2019 FINDINGS: Brain: There is atrophy and chronic small vessel disease changes. No acute intracranial abnormality. Specifically, no hemorrhage, hydrocephalus, mass lesion, acute infarction, or significant intracranial injury. Vascular: No hyperdense vessel or unexpected calcification. Skull: No acute calvarial abnormality. Sinuses/Orbits: No acute  findings Other: Soft tissue swelling in the right forehead. IMPRESSION: Atrophy, chronic microvascular disease. No acute intracranial abnormality. Electronically Signed   By: Charlett Nose M.D.   On: 10/28/2023 20:02    Review of Systems  Unable to perform ROS: Dementia   Blood pressure (!) 87/49, pulse 85, temperature 97.8  F (36.6 C), temperature source Axillary, resp. rate 18, SpO2 94%. Physical Exam Constitutional:      General: She is not in acute distress.    Appearance: She is well-developed. She is not diaphoretic.  HENT:     Head: Normocephalic and atraumatic.  Eyes:     General: No scleral icterus.       Right eye: No discharge.        Left eye: No discharge.     Conjunctiva/sclera: Conjunctivae normal.  Cardiovascular:     Rate and Rhythm: Normal rate and regular rhythm.  Pulmonary:     Effort: Pulmonary effort is normal. No respiratory distress.  Musculoskeletal:     Cervical back: Normal range of motion.     Comments: RLE No traumatic wounds, ecchymosis, or rash  Mild TTP hip  No knee or ankle effusion  Knee stable to varus/ valgus and anterior/posterior stress  Sens DPN, SPN, TN could not assess  Motor EHL, ext, flex, evers grossly intact  DP 2+, PT 2+, No significant edema  Skin:    General: Skin is warm and dry.  Neurological:     Mental Status: She is alert.  Psychiatric:        Mood and Affect: Mood normal.        Behavior: Behavior normal.     Assessment/Plan: Right hip fx -- Plan hip hemi today with Dr. Jena Gauss. Please keep NPO. Multiple medical problems including advanced dementia, frequent falls, chronic anxiety/depression, and CKD 3B -- per primary service    Freeman Caldron, PA-C Orthopedic Surgery [phone] 10/29/2023, 8:51 AM

## 2023-10-29 NOTE — Anesthesia Preprocedure Evaluation (Addendum)
 Anesthesia Evaluation  Patient identified by MRN, date of birth, ID band Patient awake    Reviewed: Allergy & Precautions, NPO status , Patient's Chart, lab work & pertinent test results, reviewed documented beta blocker date and time   Airway Mallampati: III  TM Distance: >3 FB     Dental  (+) Dental Advisory Given   Pulmonary shortness of breath and with exertion   Pulmonary exam normal breath sounds clear to auscultation       Cardiovascular hypertension, Pt. on medications and Pt. on home beta blockers Normal cardiovascular exam Rhythm:Regular Rate:Normal     Neuro/Psych   Anxiety Depression   Dementia Glaucoma Macular degeneration    GI/Hepatic negative GI ROS, Neg liver ROS,,,  Endo/Other  HLD Hyperglycemia  Renal/GU Renal InsufficiencyRenal disease  negative genitourinary   Musculoskeletal Right Femoral neck Fx    Abdominal   Peds  Hematology negative hematology ROS (+)   Anesthesia Other Findings   Reproductive/Obstetrics                              Anesthesia Physical Anesthesia Plan  ASA: 3  Anesthesia Plan: Spinal   Post-op Pain Management:    Induction: Intravenous  PONV Risk Score and Plan: 3 and Treatment may vary due to age or medical condition and Ondansetron  Airway Management Planned: Natural Airway and Simple Face Mask  Additional Equipment: None  Intra-op Plan:   Post-operative Plan:   Informed Consent: I have reviewed the patients History and Physical, chart, labs and discussed the procedure including the risks, benefits and alternatives for the proposed anesthesia with the patient or authorized representative who has indicated his/her understanding and acceptance.     Dental advisory given  Plan Discussed with: CRNA and Anesthesiologist  Anesthesia Plan Comments:          Anesthesia Quick Evaluation

## 2023-10-29 NOTE — Plan of Care (Signed)
  Problem: Education: Goal: Knowledge of General Education information will improve Description: Including pain rating scale, medication(s)/side effects and non-pharmacologic comfort measures 10/29/2023 1939 by Charma Igo, LPN Outcome: Progressing 10/29/2023 0655 by Charma Igo, LPN Outcome: Progressing   Problem: Health Behavior/Discharge Planning: Goal: Ability to manage health-related needs will improve 10/29/2023 1939 by Charma Igo, LPN Outcome: Progressing 10/29/2023 0655 by Charma Igo, LPN Outcome: Progressing   Problem: Clinical Measurements: Goal: Ability to maintain clinical measurements within normal limits will improve 10/29/2023 1939 by Charma Igo, LPN Outcome: Progressing 10/29/2023 0655 by Charma Igo, LPN Outcome: Progressing Goal: Will remain free from infection 10/29/2023 1939 by Charma Igo, LPN Outcome: Progressing 10/29/2023 0655 by Charma Igo, LPN Outcome: Progressing Goal: Diagnostic test results will improve 10/29/2023 1939 by Charma Igo, LPN Outcome: Progressing 10/29/2023 0655 by Charma Igo, LPN Outcome: Progressing Goal: Respiratory complications will improve 10/29/2023 1939 by Charma Igo, LPN Outcome: Progressing 10/29/2023 0655 by Charma Igo, LPN Outcome: Progressing Goal: Cardiovascular complication will be avoided 10/29/2023 1939 by Charma Igo, LPN Outcome: Progressing 10/29/2023 0655 by Charma Igo, LPN Outcome: Progressing   Problem: Activity: Goal: Risk for activity intolerance will decrease 10/29/2023 1939 by Charma Igo, LPN Outcome: Progressing 10/29/2023 0655 by Charma Igo, LPN Outcome: Progressing   Problem: Nutrition: Goal: Adequate nutrition will be maintained 10/29/2023 1939 by Charma Igo, LPN Outcome: Progressing 10/29/2023 0655 by Charma Igo, LPN Outcome: Progressing   Problem: Coping: Goal: Level of anxiety will decrease 10/29/2023  1939 by Charma Igo, LPN Outcome: Progressing 10/29/2023 0655 by Charma Igo, LPN Outcome: Progressing   Problem: Elimination: Goal: Will not experience complications related to bowel motility 10/29/2023 1939 by Charma Igo, LPN Outcome: Progressing 10/29/2023 0655 by Charma Igo, LPN Outcome: Progressing Goal: Will not experience complications related to urinary retention 10/29/2023 1939 by Charma Igo, LPN Outcome: Progressing 10/29/2023 0655 by Charma Igo, LPN Outcome: Progressing   Problem: Pain Managment: Goal: General experience of comfort will improve and/or be controlled 10/29/2023 1939 by Charma Igo, LPN Outcome: Progressing 10/29/2023 0655 by Charma Igo, LPN Outcome: Progressing   Problem: Safety: Goal: Ability to remain free from injury will improve 10/29/2023 1939 by Charma Igo, LPN Outcome: Progressing 10/29/2023 0655 by Charma Igo, LPN Outcome: Progressing   Problem: Skin Integrity: Goal: Risk for impaired skin integrity will decrease 10/29/2023 1939 by Charma Igo, LPN Outcome: Progressing 10/29/2023 0655 by Charma Igo, LPN Outcome: Progressing

## 2023-10-29 NOTE — Plan of Care (Signed)

## 2023-10-29 NOTE — TOC CAGE-AID Note (Signed)
 Transition of Care [facility]) - CAGE-AID Screening  Patient Details  Name: Yesenia Ramsey MRN: [id number] Date of Birth: Feb 26, 1925  Clinical Narrative:  Patient with advanced dementia, disoriented x4. Unable to complete substance abuse screening at this time.  CAGE-AID Screening: Substance Abuse Screening unable to be completed due to: : Patient unable to participate

## 2023-10-29 NOTE — Interval H&P Note (Signed)
 History and Physical Interval Note:  10/29/2023 9:11 AM  Yesenia Ramsey  has presented today for surgery, with the diagnosis of Right femoral neck fracture.  The various methods of treatment have been discussed with the patient and family. After consideration of risks, benefits and other options for treatment, the patient has consented to  Procedure(s): HEMIARTHROPLASTY, HIP, DIRECT ANTERIOR APPROACH, FOR FRACTURE (Right) as a surgical intervention.  The patient's history has been reviewed, patient examined, no change in status, stable for surgery.  I have reviewed the patient's chart and labs.  Questions were answered to the patient's satisfaction.     Caryn Bee P Katianne Barre

## 2023-10-29 NOTE — Progress Notes (Incomplete)
 Patient arrived to unit from PACU.

## 2023-10-29 NOTE — Progress Notes (Signed)
   10/29/23 1519  Vitals  Temp (!) 97.5 F (36.4 C)  Temp Source Axillary  BP (!) 85/41  MAP (mmHg) (!) 55  BP Location Right Arm  BP Method Automatic  Patient Position (if appropriate) Lying  Pulse Rate 64  Pulse Rate Source Monitor  Resp 12  Level of Consciousness  Level of Consciousness Responds to Pain  MEWS COLOR  MEWS Score Color Red  Oxygen Therapy  SpO2 93 %  O2 Device Nasal Cannula  O2 Flow Rate (L/min) 2 L/min  Pulse Oximetry Type Continuous  Pain Assessment  Pain Score Asleep  Faces Pain Scale 0  MEWS Score  MEWS Temp 0  MEWS Systolic 1  MEWS Pulse 0  MEWS RR 1  MEWS LOC 2  MEWS Score 4  Provider Notification  Provider Name/Title Dr. Pamella Pert  Date Provider Notified 10/29/23  Time Provider Notified 1335  Method of Notification Page (secure chat)  Notification Reason Other (Comment) (RED MEWS, has not voided)  Provider response See new orders  Date of Provider Response 10/29/23  Time of Provider Response 1336   CN also aware of red MEWS, per MD do in and out catheter if bladder scan is >=300.

## 2023-10-29 NOTE — Op Note (Signed)
 Orthopaedic Surgery Operative Note (CSN: [id number] ) Date of Surgery: 10/29/2023  Admit Date: 10/28/2023   Diagnoses: Pre-Op Diagnoses: Right displaced femoral neck fracture  Post-Op Diagnosis: Same  Procedures: CPT 27236-Right hip hemiarthroplasty for fracture  Surgeons : Primary: Roby Lofts, MD  Assistant: Thyra Breed, PA-C  Location: OR 12   Anesthesia: General   Antibiotics: Ancef 2g preop with 1 gm vancomycin powder placed topically   Tourniquet time: None    Estimated Blood Loss: 50 mL  Complications:* No complications entered in OR log *   Specimens:* No specimens in log *   Implants: Implant Name Type Inv. Item Serial No. Manufacturer Lot No. LRB No. Used Action  STEM FEM CMTLS HO [id number] - [id number] Stem STEM FEM CMTLS HO [id number]  [employer](ORTH,TRAU,BIO,SG) [id number] Right 1 Implanted  HEAD MOD COCR HD - NK - [id number] Orthopedic Implant HEAD MOD COCR HD - NK  [employer](ORTH,TRAU,BIO,SG) [id number] Right 1 Implanted  RINGBLOC BI POLAR [id number] - [id number] Orthopedic Implant RINGBLOC BI POLAR 28X46MM  [employer](ORTH,TRAU,BIO,SG) [id number] Right 1 Implanted     Indications for Surgery: 89 year old female who sustained a ground-level fall with a right displaced femoral neck fracture.  Due to the unstable nature of her injury I recommend proceeding with right hip hemiarthroplasty.  Patient benefits were discussed with the patient's son.  Risks include but not limited to bleeding, infection, dislocation, leg length discrepancy, periprosthetic fracture, nerve and blood vessel injury, even possibility anesthetic complications.  He agreed to proceed with surgery and consent was obtained.  Operative Findings: Right hip hemiarthroplasty for fracture using [employer] Taperloc Reduced Distal High Offset Size 9 stem.  With a 46 mm outer acetabular cup with a -3 neck modular head component.  Procedure: The patient was identified in the  preoperative holding area. Consent was confirmed with the patient and their family and all questions were answered. The operative extremity was marked after confirmation with the patient. she was then brought back to the operating room by our anesthesia colleagues.  She was placed under general anesthetic and carefully transferred over to a Hana table.  Traction was applied to the right lower extremity and AP pelvis and a AP hip was obtained to establish preoperative leg length.  The right hip was prepped and draped in usual sterile fashion.  A timeout was performed to verify the patient, the procedure, and the extremity.  Preoperative antibiotics were dosed.  A standard anterior process of the hip was made and carried down through skin and subcutaneous tissue.  Identified the lateral border of the TFL and incised through the TFL fascia mobilizing of the muscle belly and entering the interval.  I then coagulated the crossing bleeders from the ascending lateral branch.  I then released some of the fascia overlying the vastus lateralis to get some mobilization of the femur.  I then performed a capsulotomy and carried it to the shoulder of the femoral neck.  I then proceeded to release the capsule off of the inferior neck until I reached the lesser trochanter.  I then marked 1 fingerbreadth above the lesser trochanter.  I performed a femoral neck cut and removed the intercalary femoral neck.  I then used a corkscrew to remove the femoral head and measured it and then templated the acetabular component.  I decided to use a 46 mm head.  Once I had the size of the head chosen I then worked to get exposure of the femur to deliver  it through the wound.  I proceeded to release a portion of the posterior and lateral capsule.  I then delivered the femur with hyperextension and adduction.  I then proceeded to use a canal finder to enter the medullary canal.  I then proceeded to broach the femur starting with a size 4.  I  worked myself up to a size 7 and obtain a fluoroscopic image which showed that I was in a slight amount of varus.  I proceeded to lateralize the broaches to be able to get in a good position.  I was able to upsized to a size 9.  I then trialed off of this with a high offset neck and the 46 mm head.  This showed adequate leg lengths and stability.  I then removed the trial components placed the size 9 Taperloc reduced distal stem with a high offset neck.  I then placed the final bipolar component with a modular head component with a -3 mm neck and a 46 mm acetabular cup.  The hip was reduced and fluoroscopic imaging showed adequate position and leg lengths.  The incision was copiously irrigated.  #1 Ethibond was used to close the capsule.  A gram of vancomycin powder was placed into the wound.  The TFL fascia was closed with #1 Vicryl suture.  The skin was closed with 2-0 Monocryl and Dermabond.  A sterile dressing was applied.  The patient was then awoke from anesthesia and taken to the PACU in stable condition.  Post Op Plan/Instructions: Patient will be weightbearing as tolerated.  She will have no hip precautions.  Will have her mobilize with physical and Occupational Therapy.  She will receive aspirin 81 mg for DVT prophylaxis.  She will receive postoperative Ancef.  I was present and performed the entire surgery.  Thyra Breed, PA-C did assist me throughout the case. An assistant was necessary given the difficulty in approach, maintenance of reduction and ability to instrument the fracture.   Truitt Merle, MD [facility]

## 2023-10-29 NOTE — Progress Notes (Signed)
 PROGRESS NOTE  Yesenia Ramsey NWG:[id number] DOB: 06-18-1925 DOA: 10/28/2023 PCP: Thana Ates, MD   LOS: 1 day   Brief Narrative / Interim history: 89 y.o. female with medical history significant for advanced dementia, frequent falls, chronic anxiety/depression, CKD 3B, who presents to the ER from home after a mechanical fall. In the ER, noted to have hematoma and laceration to right side of head. Left eye is swollen shut from previous fall. Imaging revealed right acute subcapital proximal right femoral neck fracture with superior displacement, mild angulation and internal rotation. Orthopedic surgery consulted and she was admitted to the hospital   Subjective / 24h Interval events: Seen in PACU, hard of hearing, opens her eyes but appears confused  Assesement and Plan: Principal problem Right hip fracture, post mechanical fall -orthopedic surgery consulted, appreciate input.  She was taken to the OR this morning status post right hip hemiarthroplasty.  Monitor postoperative recovery, PT consult tomorrow, DVT prophylaxis per orthopedic surgery  Active problems  Leukocytosis - possibly reactive in the setting of right hip fracture  Hyperglycemia - Obtain A1c  Lab Results  Component Value Date   HGBA1C 5.6 10/29/2023   Chronic anxiety/depression - Resume home regimen   Severe protein calorie malnutrition - Severe muscle mass loss   AKI on CKD 3B -baseline creatinine 1.4, at baseline on admission however increased this morning at 2.1.  Monitor closely postoperatively  Scheduled Meds:  chlorhexidine  60 mL Topical Once   [MAR Hold] cycloSPORINE  1 drop Both Eyes BID   [MAR Hold] LORazepam  0.5 mg Oral QHS   [MAR Hold] sertraline  25 mg Oral Daily   Continuous Infusions:  sodium chloride Stopped (10/29/23 1205)   lactated ringers 50 mL/hr at 10/29/23 0015   PRN Meds:.[MAR Hold] acetaminophen, fentaNYL (SUBLIMAZE) injection, [MAR Hold]  HYDROmorphone (DILAUDID) injection, [MAR Hold]  melatonin, ondansetron (ZOFRAN) IV, [MAR Hold] oxyCODONE, [MAR Hold] polyethylene glycol, [MAR Hold] prochlorperazine  Current Outpatient Medications  Medication Instructions   acetaminophen (TYLENOL) 500 mg, Every 6 hours PRN   bimatoprost (LUMIGAN) 0.03 % ophthalmic solution 1 drop, Daily at bedtime   bisoprolol (ZEBETA) 10 mg, Oral, Daily   chlorhexidine (HIBICLENS) 4 % external liquid 1 Application, Topical, As directed, Use as directed daily for 5 days every other week for 6 weeks.   Coenzyme Q10 (COQ-10) 100 MG CAPS Take by mouth.   cyanocobalamin 3,000 mcg, 2 times daily   furosemide (LASIX) 40 mg, Oral, Daily   latanoprost (XALATAN) 0.005 % ophthalmic solution 1 drop, Daily at bedtime   LORazepam (ATIVAN) 0.5 mg, Oral, Daily at bedtime   Multiple Vitamins-Minerals (ICAPS AREDS FORMULA PO) 1 capsule, 2 times daily   mupirocin ointment (BACTROBAN) 2 % 1 Application, Nasal, 2 times daily, Use as directed 2 times daily for 5 days every other week for 6 weeks.   potassium chloride (MICRO-K) 10 MEQ CR capsule 10 mEq, Oral, Daily   RESTASIS 0.05 % ophthalmic emulsion INSTILL ONE DROP IN BOTH EYES TWICE A DAY AS DIRECTED   sertraline (ZOLOFT) 25 mg, Oral, Daily   Vitamin D, Cholecalciferol, 25 MCG (1000 UT) CAPS 1 capsule, Daily    Diet Orders (From admission, onward)     Start     Ordered   10/29/23 0856  Diet NPO time specified Except for: Sips with Meds  Diet effective now       Question:  Except for  Answer:  Sips with Meds   10/29/23 0855  DVT prophylaxis: SCDs Start: 10/28/23 2236   Lab Results  Component Value Date   PLT 244 10/29/2023      Code Status: Limited: Do not attempt resuscitation (DNR) -DNR-LIMITED -Do Not Intubate/DNI   Family Communication: no family at bedside   Status is: Inpatient  Remains inpatient appropriate because: severity of illness  Level of care: Telemetry Surgical  Consultants:  Orthopedic surgery   Objective: Vitals:    10/29/23 1204 10/29/23 1210 10/29/23 1215 10/29/23 1230  BP: (!) 97/41  95/64 (!) 102/50  Pulse:  61 (!) 194 (!) 58  Resp: 11 10 16 10   Temp:      TempSrc:      SpO2: 95%  (!) 49% 97%    Intake/Output Summary (Last 24 hours) at 10/29/2023 1242 Last data filed at 10/29/2023 1205 Gross per 24 hour  Intake 1900 ml  Output 125 ml  Net 1775 ml   Wt Readings from Last 3 Encounters:  04/19/19 47.9 kg  04/07/19 46.9 kg  04/01/19 48.5 kg    Examination:  Constitutional: NAD Eyes: no scleral icterus, periorbital ecchymosis present ENMT: Mucous membranes are moist.  Neck: normal, supple Respiratory: clear to auscultation bilaterally, no wheezing, no crackles.  Cardiovascular: Regular rate and rhythm, no murmurs / rubs / gallops. No LE edema.  Abdomen: non distended, no tenderness. Bowel sounds positive.  Musculoskeletal: no clubbing / cyanosis.   Data Reviewed: I have independently reviewed following labs and imaging studies   CBC Recent Labs  Lab 10/28/23 2126 10/29/23 0630  WBC 17.8* 16.3*  HGB 11.8* 11.8*  HCT 37.7 37.8  PLT 282 244  MCV 95.4 95.5  MCH 29.9 29.8  MCHC 31.3 31.2  RDW 13.6 13.8    Recent Labs  Lab 10/28/23 2126 10/29/23 0630  NA 138 138  K 4.1 5.0  CL 101 101  CO2 24 19*  GLUCOSE 161* 213*  BUN 36* 43*  CREATININE 1.38* 2.10*  CALCIUM 8.5* 8.9  MG  --  2.4  HGBA1C  --  5.6    ------------------------------------------------------------------------------------------------------------------ No results for input(s): "CHOL", "HDL", "LDLCALC", "TRIG", "CHOLHDL", "LDLDIRECT" in the last 72 hours.  Lab Results  Component Value Date   HGBA1C 5.6 10/29/2023   ------------------------------------------------------------------------------------------------------------------ No results for input(s): "TSH", "T4TOTAL", "T3FREE", "THYROIDAB" in the last 72 hours.  Invalid input(s): "FREET3"  Cardiac Enzymes No results for input(s): "CKMB",  "TROPONINI", "MYOGLOBIN" in the last 168 hours.  Invalid input(s): "CK" ------------------------------------------------------------------------------------------------------------------ No results found for: "BNP"  CBG: No results for input(s): "GLUCAP" in the last 168 hours.  Recent Results (from the past 240 hours)  Surgical pcr screen     Status: Abnormal   Collection Time: 10/29/23  8:20 AM   Specimen: Nasal Mucosa; Nasal Swab  Result Value Ref Range Status   MRSA, PCR NEGATIVE NEGATIVE Final   Staphylococcus aureus POSITIVE (A) NEGATIVE Final    Comment: (NOTE) The Xpert SA Assay (FDA approved for NASAL specimens in patients 57 years of age and older), is one component of a comprehensive surveillance program. It is not intended to diagnose infection nor to guide or monitor treatment. Performed at [facility], [address], [place], [place] [address]      Radiology Studies: DG HIP UNILAT WITH PELVIS 1V RIGHT Result Date: 10/29/2023 CLINICAL DATA:  Elective surgery. EXAM: DG HIP (WITH OR WITHOUT PELVIS) 1V RIGHT COMPARISON:  Preoperative imaging. FINDINGS: Four fluoroscopic spot views of the pelvis and right hip obtained in the operating room.  Images during hip arthroplasty. Fluoroscopy time 10.5 seconds. Dose 0.6 mGy. IMPRESSION: Intraoperative fluoroscopy during right hip arthroplasty. Electronically Signed   By: Narda Rutherford M.D.   On: 10/29/2023 12:26   DG C-Arm 1-60 Min-No Report Result Date: 10/29/2023 Fluoroscopy was utilized by the requesting physician.  No radiographic interpretation.   DG C-Arm 1-60 Min-No Report Result Date: 10/29/2023 Fluoroscopy was utilized by the requesting physician.  No radiographic interpretation.   DG Knee Complete 4 Views Right Result Date: 10/28/2023 CLINICAL DATA:  Tripped, fell, right hip fracture, pain EXAM: RIGHT KNEE - COMPLETE 4+ VIEW COMPARISON:  None Available. FINDINGS: Frontal, bilateral oblique, and lateral views  of the right knee are obtained. No acute fracture, subluxation, or dislocation. Mild 3 compartmental osteoarthritis greatest in the patellofemoral compartment. No joint effusion. Soft tissues are unremarkable. IMPRESSION: 1. No acute displaced fracture. 2. Mild 3 compartmental osteoarthritis, greatest in the patellofemoral compartment. Electronically Signed   By: Sharlet Salina M.D.   On: 10/28/2023 21:34   DG Hip Unilat W or Wo Pelvis 2-3 Views Right Result Date: 10/28/2023 CLINICAL DATA:  Fall  with right hip fracture. EXAM: DG HIP (WITH OR WITHOUT PELVIS) 2-3V RIGHT COMPARISON:  AP pelvis 03/24/2019. FINDINGS: Generalized osteopenia. AP pelvis without evidence of displaced pelvic fracture or diastasis. There is spurring of the SI joints and pubic symphysis without widening. There is an acute subcapital proximal right femoral neck closed fracture. The distal fragment is superiorly displaced 2 cm with mild medial and ventral angulation and internal rotation. There is mild nonerosive arthrosis at both hips. Visualized proximal left femur is intact. Soft tissues are unremarkable apart from vascular calcifications. Pronounced lumbar levoscoliosis partially visible. IMPRESSION: 1. Acute subcapital proximal right femoral neck fracture with superior displacement, mild angulation and internal rotation. 2. Osteopenia and degenerative change. 3. Lumbar levoscoliosis partially visible. Electronically Signed   By: Almira Bar M.D.   On: 10/28/2023 20:21   CT Cervical Spine Wo Contrast Result Date: 10/28/2023 CLINICAL DATA:  Neck trauma (Age >= 65y).  Fall. EXAM: CT CERVICAL SPINE WITHOUT CONTRAST TECHNIQUE: Multidetector CT imaging of the cervical spine was performed without intravenous contrast. Multiplanar CT image reconstructions were also generated. RADIATION DOSE REDUCTION: This exam was performed according to the departmental dose-optimization program which includes automated exposure control, adjustment of the  mA and/or kV according to patient size and/or use of iterative reconstruction technique. COMPARISON:  03/24/2019 FINDINGS: Alignment: No subluxation Skull base and vertebrae: Moderate compression fracture involving the T5 vertebral body, age indeterminate. No visible additional fracture or focal bone lesion. Soft tissues and spinal canal: No prevertebral fluid or swelling. No visible canal hematoma. Disc levels: Degenerative disc disease most pronounced at C5-6 and C6-7. Mild-to-moderate degenerative facet disease bilaterally, left greater than right. Upper chest: No acute findings.  Biapical scarring. Other: None IMPRESSION: Degenerative disc and facet disease. Moderate compression fracture involving the T5 vertebral body, age indeterminate. Electronically Signed   By: Charlett Nose M.D.   On: 10/28/2023 20:06   CT Maxillofacial Wo Contrast Result Date: 10/28/2023 CLINICAL DATA:  Facial trauma, blunt.  Fall. EXAM: CT MAXILLOFACIAL WITHOUT CONTRAST TECHNIQUE: Multidetector CT imaging of the maxillofacial structures was performed. Multiplanar CT image reconstructions were also generated. RADIATION DOSE REDUCTION: This exam was performed according to the departmental dose-optimization program which includes automated exposure control, adjustment of the mA and/or kV according to patient size and/or use of iterative reconstruction technique. COMPARISON:  None Available. FINDINGS: Osseous: Image quality is motion degraded. No visible  facial fracture. Zygomatic arches and mandible appear intact. Orbits: Negative. No traumatic or inflammatory finding. Sinuses: Clear Soft tissues: Soft tissue swelling in the right forehead. Limited intracranial: See head CT report IMPRESSION: No facial or orbital fracture. Electronically Signed   By: Charlett Nose M.D.   On: 10/28/2023 20:03   CT Head Wo Contrast Result Date: 10/28/2023 CLINICAL DATA:  Head trauma, minor (Age >= 65y).  Fall. EXAM: CT HEAD WITHOUT CONTRAST TECHNIQUE:  Contiguous axial images were obtained from the base of the skull through the vertex without intravenous contrast. RADIATION DOSE REDUCTION: This exam was performed according to the departmental dose-optimization program which includes automated exposure control, adjustment of the mA and/or kV according to patient size and/or use of iterative reconstruction technique. COMPARISON:  03/24/2019 FINDINGS: Brain: There is atrophy and chronic small vessel disease changes. No acute intracranial abnormality. Specifically, no hemorrhage, hydrocephalus, mass lesion, acute infarction, or significant intracranial injury. Vascular: No hyperdense vessel or unexpected calcification. Skull: No acute calvarial abnormality. Sinuses/Orbits: No acute findings Other: Soft tissue swelling in the right forehead. IMPRESSION: Atrophy, chronic microvascular disease. No acute intracranial abnormality. Electronically Signed   By: Charlett Nose M.D.   On: 10/28/2023 20:02     Pamella Pert, MD, PhD [facility]  Between 7 am - 7 pm I am available, please contact me via Amion (for emergencies) or Securechat (non urgent messages)  Between 7 pm - 7 am I am not available, please contact night coverage MD/APP via Amion

## 2023-10-29 NOTE — Progress Notes (Signed)
 I was asked for 2nd assessment of Yesenia Ramsey due to persistent hypotension. Her BP has been borderline throughout the afternoon despite volume resuscitation (+3L). Pt is arousable, skin dry, pink with bruises all over, skin tenting. HR 61 and BP is 73/58. Her last recorded output was 400cc of straight cathed urine about 1800. Her BP is likely close to baseline. Orders received for IVF bolus and albumin. Will check CBG to r/o hypoglycemia.

## 2023-10-29 NOTE — Transfer of Care (Signed)
 Immediate Anesthesia Transfer of Care Note  Patient: Yesenia Ramsey  Procedure(s) Performed: HEMIARTHROPLASTY, HIP, DIRECT ANTERIOR APPROACH, FOR FRACTURE (Right: Hip)  Patient Location: PACU  Anesthesia Type:General  Level of Consciousness: responds to stimulation -- patient with severe dementia at baseline  Airway & Oxygen Therapy: Patient Spontanous Breathing and Patient connected to face mask oxygen  Post-op Assessment: Report given to RN and Post -op Vital signs reviewed and stable  Post vital signs: Reviewed and stable  Last Vitals:  Vitals Value Taken Time  BP 135/48 10/29/23 1213  Temp    Pulse 187 10/29/23 1215  Resp 14 10/29/23 1215  SpO2 53 % 10/29/23 1215  Vitals shown include unfiled device data.  Last Pain:  Vitals:   10/29/23 0836  TempSrc: Axillary         Complications: No notable events documented.

## 2023-10-29 NOTE — Anesthesia Postprocedure Evaluation (Signed)
 Anesthesia Post Note  Patient: Yesenia Ramsey  Procedure(s) Performed: HEMIARTHROPLASTY, HIP, DIRECT ANTERIOR APPROACH, FOR FRACTURE (Right: Hip)     Patient location during evaluation: PACU Anesthesia Type: General Level of consciousness: awake and alert Pain management: pain level controlled Vital Signs Assessment: post-procedure vital signs reviewed and stable Respiratory status: spontaneous breathing, nonlabored ventilation, respiratory function stable and patient connected to nasal cannula oxygen Cardiovascular status: blood pressure returned to baseline and stable Postop Assessment: no apparent nausea or vomiting Anesthetic complications: no  No notable events documented.  Last Vitals:  Vitals:   10/29/23 1457 10/29/23 1519  BP:  (!) 85/41  Pulse: 67 64  Resp: 13 12  Temp:  (!) 36.4 C  SpO2: 95% 93%    Last Pain:  Vitals:   10/29/23 1519  TempSrc: Axillary  PainSc: Asleep                 Lenell Mcconnell L Murlean Seelye

## 2023-10-29 NOTE — Anesthesia Procedure Notes (Signed)
 Procedure Name: Intubation Date/Time: 10/29/2023 9:37 AM  Performed by: Gus Puma, CRNAPre-anesthesia Checklist: Patient identified, Emergency Drugs available, Suction available and Patient being monitored Patient Re-evaluated:Patient Re-evaluated prior to induction Oxygen Delivery Method: Circle System Utilized Preoxygenation: Pre-oxygenation with 100% oxygen Induction Type: IV induction Ventilation: Mask ventilation without difficulty Laryngoscope Size: Mac and 3 Grade View: Grade I Tube type: Oral Tube size: 7.0 mm Number of attempts: 1 Airway Equipment and Method: Stylet Placement Confirmation: ETT inserted through vocal cords under direct vision, positive ETCO2 and breath sounds checked- equal and bilateral Secured at: 19 cm Tube secured with: Tape Dental Injury: Teeth and Oropharynx as per pre-operative assessment

## 2023-10-30 ENCOUNTER — Encounter (HOSPITAL_COMMUNITY): Payer: Self-pay | Admitting: Student

## 2023-10-30 ENCOUNTER — Inpatient Hospital Stay (HOSPITAL_COMMUNITY)

## 2023-10-30 DIAGNOSIS — S72001A Fracture of unspecified part of neck of right femur, initial encounter for closed fracture: Secondary | ICD-10-CM | POA: Diagnosis not present

## 2023-10-30 LAB — COMPREHENSIVE METABOLIC PANEL
ALT: 279 U/L — ABNORMAL HIGH (ref 0–44)
AST: 534 U/L — ABNORMAL HIGH (ref 15–41)
Albumin: 3.2 g/dL — ABNORMAL LOW (ref 3.5–5.0)
Alkaline Phosphatase: 105 U/L (ref 38–126)
Anion gap: 19 — ABNORMAL HIGH (ref 5–15)
BUN: 45 mg/dL — ABNORMAL HIGH (ref 8–23)
CO2: 14 mmol/L — ABNORMAL LOW (ref 22–32)
Calcium: 7.8 mg/dL — ABNORMAL LOW (ref 8.9–10.3)
Chloride: 109 mmol/L (ref 98–111)
Creatinine, Ser: 2.56 mg/dL — ABNORMAL HIGH (ref 0.44–1.00)
GFR, Estimated: 17 mL/min — ABNORMAL LOW (ref >60)
Glucose, Bld: 117 mg/dL — ABNORMAL HIGH (ref 70–99)
Potassium: 5.7 mmol/L — ABNORMAL HIGH (ref 3.5–5.1)
Sodium: 142 mmol/L (ref 135–145)
Total Bilirubin: 0.8 mg/dL (ref 0.0–1.2)
Total Protein: 5.5 g/dL — ABNORMAL LOW (ref 6.5–8.1)

## 2023-10-30 LAB — POCT I-STAT 7, (LYTES, BLD GAS, ICA,H+H)
Acid-base deficit: 6 mmol/L — ABNORMAL HIGH (ref 0.0–2.0)
Acid-base deficit: 5 mmol/L — ABNORMAL HIGH (ref 0.0–2.0)
Acid-base deficit: 7 mmol/L — ABNORMAL HIGH (ref 0.0–2.0)
Bicarbonate: 20.8 mmol/L (ref 20.0–28.0)
Bicarbonate: 18.5 mmol/L — ABNORMAL LOW (ref 20.0–28.0)
Bicarbonate: 20.9 mmol/L (ref 20.0–28.0)
Calcium, Ion: 1.05 mmol/L — ABNORMAL LOW (ref 1.15–1.40)
Calcium, Ion: 0.98 mmol/L — ABNORMAL LOW (ref 1.15–1.40)
Calcium, Ion: 1.01 mmol/L — ABNORMAL LOW (ref 1.15–1.40)
HCT: 30 % — ABNORMAL LOW (ref 36.0–46.0)
HCT: 26 % — ABNORMAL LOW (ref 36.0–46.0)
HCT: 29 % — ABNORMAL LOW (ref 36.0–46.0)
Hemoglobin: 10.2 g/dL — ABNORMAL LOW (ref 12.0–15.0)
Hemoglobin: 8.8 g/dL — ABNORMAL LOW (ref 12.0–15.0)
Hemoglobin: 9.9 g/dL — ABNORMAL LOW (ref 12.0–15.0)
O2 Saturation: 100 %
O2 Saturation: 100 %
O2 Saturation: 98 %
Patient temperature: 37
Potassium: 3.7 mmol/L (ref 3.5–5.1)
Potassium: 3.3 mmol/L — ABNORMAL LOW (ref 3.5–5.1)
Potassium: 3.5 mmol/L (ref 3.5–5.1)
Sodium: 141 mmol/L (ref 135–145)
Sodium: 141 mmol/L (ref 135–145)
Sodium: 144 mmol/L (ref 135–145)
TCO2: 22 mmol/L (ref 22–32)
TCO2: 19 mmol/L — ABNORMAL LOW (ref 22–32)
TCO2: 22 mmol/L (ref 22–32)
pCO2 arterial: 48.3 mmHg — ABNORMAL HIGH (ref 32–48)
pCO2 arterial: 34.5 mmHg (ref 32–48)
pCO2 arterial: 40.8 mmHg (ref 32–48)
pH, Arterial: 7.243 — ABNORMAL LOW (ref 7.35–7.45)
pH, Arterial: 7.318 — ABNORMAL LOW (ref 7.35–7.45)
pH, Arterial: 7.337 — ABNORMAL LOW (ref 7.35–7.45)
pO2, Arterial: 446 mmHg — ABNORMAL HIGH (ref 83–108)
pO2, Arterial: 113 mmHg — ABNORMAL HIGH (ref 83–108)
pO2, Arterial: 181 mmHg — ABNORMAL HIGH (ref 83–108)

## 2023-10-30 LAB — TYPE AND SCREEN
ABO/RH(D): O POS
Antibody Screen: NEGATIVE

## 2023-10-30 LAB — CBC
HCT: 32.8 % — ABNORMAL LOW (ref 36.0–46.0)
Hemoglobin: 9.7 g/dL — ABNORMAL LOW (ref 12.0–15.0)
MCH: 29.7 pg (ref 26.0–34.0)
MCHC: 29.6 g/dL — ABNORMAL LOW (ref 30.0–36.0)
MCV: 100.3 fL — ABNORMAL HIGH (ref 80.0–100.0)
Platelets: 190 10*3/uL (ref 150–400)
RBC: 3.27 MIL/uL — ABNORMAL LOW (ref 3.87–5.11)
RDW: 14.5 % (ref 11.5–15.5)
WBC: 17.4 10*3/uL — ABNORMAL HIGH (ref 4.0–10.5)
nRBC: 0 % (ref 0.0–0.2)

## 2023-10-30 LAB — PHOSPHORUS: Phosphorus: 6.7 mg/dL — ABNORMAL HIGH (ref 2.5–4.6)

## 2023-10-30 LAB — ABO/RH: ABO/RH(D): O POS

## 2023-10-30 LAB — MAGNESIUM: Magnesium: 2.2 mg/dL (ref 1.7–2.4)

## 2023-10-30 MED ORDER — CHLORHEXIDINE GLUCONATE CLOTH 2 % EX PADS
6.0000 | MEDICATED_PAD | Freq: Every day | CUTANEOUS | Status: DC
  Administered 2023-10-30: 6 via TOPICAL

## 2023-10-30 MED ORDER — DEXTROSE 50 % IV SOLN
1.0000 | Freq: Once | INTRAVENOUS | Status: AC
  Administered 2023-10-30: 50 mL via INTRAVENOUS
  Filled 2023-10-30: qty 50

## 2023-10-30 MED ORDER — SODIUM ZIRCONIUM CYCLOSILICATE 10 G PO PACK: 10.0000 g | PACK | Freq: Once | ORAL | Status: DC

## 2023-10-30 MED ORDER — INSULIN ASPART 100 UNIT/ML IJ SOLN
5.0000 [IU] | Freq: Once | INTRAMUSCULAR | Status: AC
  Administered 2023-10-30: 5 [IU] via SUBCUTANEOUS

## 2023-10-30 MED ORDER — ALBUTEROL SULFATE (2.5 MG/3ML) 0.083% IN NEBU
2.5000 mg | INHALATION_SOLUTION | RESPIRATORY_TRACT | Status: AC
Start: 2023-10-30 — End: 2023-10-30
  Administered 2023-10-30 (×2): 2.5 mg via RESPIRATORY_TRACT
  Filled 2023-10-30 (×2): qty 3

## 2023-10-30 MED ORDER — LACTATED RINGERS IV SOLN: INTRAVENOUS | Status: DC

## 2023-10-30 MED ORDER — MUPIROCIN 2 % EX OINT: 1.0000 | TOPICAL_OINTMENT | Freq: Two times a day (BID) | CUTANEOUS | Status: DC

## 2023-10-30 MED ORDER — MUPIROCIN 2 % EX OINT
1.0000 | TOPICAL_OINTMENT | Freq: Two times a day (BID) | CUTANEOUS | Status: DC
  Administered 2023-10-30 (×2): 1 via NASAL
  Filled 2023-10-30 (×2): qty 22

## 2023-10-30 NOTE — Progress Notes (Signed)
 Orthopaedic Trauma Progress Note  S: Sleeping, no pain. Noted BP issues from yesterday evening  O:  Vitals:   10/30/23 0455 10/30/23 0815  BP: 102/77 (!) 80/43  Pulse: 86 63  Resp:  16  Temp:  97.6 F (36.4 C)  SpO2:  (!) 45%    NAD Sleeping VHQ:IONGEXBM clean and dry, Swelling to leg appropriate. Not following commands currently  Imaging: Stable post op imaging  Labs:  Results for orders placed or performed during the hospital encounter of 10/28/23 (from the past 24 hours)  VITAMIN D 25 Hydroxy (Vit-D Deficiency, Fractures)     Status: None   Collection Time: 10/29/23  5:46 PM  Result Value Ref Range   Vit D, 25-Hydroxy 69.93 30 - 100 ng/mL  Urinalysis, Routine w reflex microscopic -Urine, Random     Status: Abnormal   Collection Time: 10/29/23  5:51 PM  Result Value Ref Range   Color, Urine YELLOW YELLOW   APPearance HAZY (A) CLEAR   Specific Gravity, Urine 1.012 1.005 - 1.030   pH 5.0 5.0 - 8.0   Glucose, UA NEGATIVE NEGATIVE mg/dL   Hgb urine dipstick NEGATIVE NEGATIVE   Bilirubin Urine NEGATIVE NEGATIVE   Ketones, ur NEGATIVE NEGATIVE mg/dL   Protein, ur 30 (A) NEGATIVE mg/dL   Nitrite NEGATIVE NEGATIVE   Leukocytes,Ua NEGATIVE NEGATIVE   RBC / HPF 0-5 0 - 5 RBC/hpf   WBC, UA 0-5 0 - 5 WBC/hpf   Bacteria, UA RARE (A) NONE SEEN   Squamous Epithelial / HPF 0-5 0 - 5 /HPF   Mucus PRESENT    Hyaline Casts, UA PRESENT   Glucose, capillary     Status: None   Collection Time: 10/29/23 11:56 PM  Result Value Ref Range   Glucose-Capillary 96 70 - 99 mg/dL  CBC     Status: Abnormal   Collection Time: 10/29/23 11:57 PM  Result Value Ref Range   WBC 17.4 (H) 4.0 - 10.5 K/uL   RBC 3.27 (L) 3.87 - 5.11 MIL/uL   Hemoglobin 9.7 (L) 12.0 - 15.0 g/dL   HCT 84.1 (L) 32.4 - 40.1 %   MCV 100.3 (H) 80.0 - 100.0 fL   MCH 29.7 26.0 - 34.0 pg   MCHC 29.6 (L) 30.0 - 36.0 g/dL   RDW 02.7 25.3 - 66.4 %   Platelets 190 150 - 400 K/uL   nRBC 0.0 0.0 - 0.2 %  Comprehensive  metabolic panel     Status: Abnormal   Collection Time: 10/29/23 11:57 PM  Result Value Ref Range   Sodium 142 135 - 145 mmol/L   Potassium 5.7 (H) 3.5 - 5.1 mmol/L   Chloride 109 98 - 111 mmol/L   CO2 14 (L) 22 - 32 mmol/L   Glucose, Bld 117 (H) 70 - 99 mg/dL   BUN 45 (H) 8 - 23 mg/dL   Creatinine, Ser 4.03 (H) 0.44 - 1.00 mg/dL   Calcium 7.8 (L) 8.9 - 10.3 mg/dL   Total Protein 5.5 (L) 6.5 - 8.1 g/dL   Albumin 3.2 (L) 3.5 - 5.0 g/dL   AST 474 (H) 15 - 41 U/L   ALT 279 (H) 0 - 44 U/L   Alkaline Phosphatase 105 38 - 126 U/L   Total Bilirubin 0.8 0.0 - 1.2 mg/dL   GFR, Estimated 17 (L) >60 mL/min   Anion gap 19 (H) 5 - 15  Magnesium     Status: None   Collection Time: 10/29/23 11:57 PM  Result Value  Ref Range   Magnesium 2.2 1.7 - 2.4 mg/dL  Phosphorus     Status: Abnormal   Collection Time: 10/29/23 11:57 PM  Result Value Ref Range   Phosphorus 6.7 (H) 2.5 - 4.6 mg/dL    Assessment: 89 yo F s/p R hip hemiarthroplasty for femoral neck fracture   Weightbearing: WBAT no hip precautions  Insicional and dressing care: Dressing to remain in place until follow up  Orthopedic device(s):None  CV/Blood loss:Hgb 9.7 this AM, acute blood loss anemia and likely dilutional, continue to monitor  Pain management: Oxycodone written for but would try to avoid narcotics and just use tylenol as needed  VTE prophylaxis: Asa 81 mg daily  ID: Ancef IV postop  Dispo: PT/OT will need SNF  Follow - up plan: 2 weeks for x-rays and wound check   Roby Lofts, MD [facility] [phone] (office) [email or web address]

## 2023-10-30 NOTE — Progress Notes (Signed)
 OT Cancellation Note  Patient Details Name: Yesenia Ramsey MRN: [id number] DOB: [birth year]   Cancelled Treatment:    Reason Eval/Treat Not Completed: OT screened, no needs identified, will sign off- per family, pt remains minimally arousable post-surgery, currently is a red MEWS, family states plan is for comfort care. OT to sign off, please reconsult if pt status changes.   Barry Brunner, OT Acute Rehabilitation Services Office [phone] Secure Chat Preferred   Yesenia Ramsey 10/30/2023, 11:34 AM

## 2023-10-30 NOTE — Progress Notes (Signed)
 PROGRESS NOTE  Barney Gertsch GEX:[id number] DOB: [birth year] DOA: 10/28/2023 PCP: Thana Ates, MD   LOS: 2 days   Brief Narrative / Interim history: 89 y.o. female with medical history significant for advanced dementia, frequent falls, chronic anxiety/depression, CKD 3B, who presents to the ER from home after a mechanical fall. In the ER, noted to have hematoma and laceration to right side of head. Left eye is swollen shut from previous fall. Imaging revealed right acute subcapital proximal right femoral neck fracture with superior displacement, mild angulation and internal rotation. Orthopedic surgery consulted and she was admitted to the hospital   Subjective / 24h Interval events: Remains poorly responsive, opens her eyes when prompted but has no meaningful interaction  Assesement and Plan: Principal problem Right hip fracture, post mechanical fall -orthopedic surgery consulted, appreciate input.  She was taken to the OR on 3/20 status post right hip hemiarthroplasty.    Active problems  AKI on CKD 3B -baseline creatinine 1.4, she was at baseline on admission, increased to 2.1, preoperatively, unfortunately continues to get worse with BUN 45 and creatinine 2.5 this morning -This is hemodynamically mediated due to significant perioperative hypotension  Hyperkalemia -in the setting of acute kidney injury, potassium up to 5.7.  Unable to take [facility] as she is not alert enough to take p.o.  Insulin/dextrose, albuterol  Elevated liver enzymes -she has a degree of acute liver injury in the setting of prolonged hypotension yesterday  Hypotension-hold home furosemide, patient was started on midodrine yesterday however she was unable to take p.o. intake, requiring multiple rounds of fluid boluses.  This temporarily improved her blood pressure, however developed crackles and fluids had to be discontinued.  Remains hypotensive with a systolic in the 80s, monitor  Acute hypoxic respiratory failure  -in the setting of fluid overload, bilateral pleural effusions based on chest x-ray.  This is likely in the setting of IV fluids for hypotension.  She received albumin yesterday  Leukocytosis - possibly reactive in the setting of right hip fracture  Hyperglycemia - Obtain A1c  Lab Results  Component Value Date   HGBA1C 5.6 10/29/2023   Chronic anxiety/depression - Resume home regimen   Severe protein calorie malnutrition - Severe muscle mass loss     Scheduled Meds:  albuterol  2.5 mg Nebulization Q4H   aspirin EC  81 mg Oral Daily   Chlorhexidine Gluconate Cloth  6 each Topical Daily   cycloSPORINE  1 drop Both Eyes BID   dextrose  1 ampule Intravenous Once   docusate sodium  100 mg Oral BID   insulin aspart  5 Units Subcutaneous Once   LORazepam  0.5 mg Oral QHS   midodrine  2.5 mg Oral TID WC   midodrine  5 mg Oral STAT   mupirocin ointment  1 Application Nasal BID   sertraline  25 mg Oral Daily   sodium zirconium cyclosilicate  10 g Oral Once   Continuous Infusions:   ceFAZolin (ANCEF) IV 2 g (10/30/23 0425)   PRN Meds:.acetaminophen, HYDROmorphone (DILAUDID) injection, melatonin, oxyCODONE, polyethylene glycol, prochlorperazine  Current Outpatient Medications  Medication Instructions   acetaminophen (TYLENOL) 500 mg, Every 6 hours PRN   bimatoprost (LUMIGAN) 0.03 % ophthalmic solution 1 drop, Daily at bedtime   bisoprolol (ZEBETA) 10 mg, Oral, Daily   chlorhexidine (HIBICLENS) 4 % external liquid 1 Application, Topical, As directed, Use as directed daily for 5 days every other week for 6 weeks.   Coenzyme Q10 (COQ-10) 100 MG CAPS Take  by mouth.   cyanocobalamin 3,000 mcg, 2 times daily   furosemide (LASIX) 40 mg, Oral, Daily   latanoprost (XALATAN) 0.005 % ophthalmic solution 1 drop, Daily at bedtime   LORazepam (ATIVAN) 0.5 mg, Oral, Daily at bedtime   Multiple Vitamins-Minerals (ICAPS AREDS FORMULA PO) 1 capsule, 2 times daily   mupirocin ointment (BACTROBAN) 2  % 1 Application, Nasal, 2 times daily, Use as directed 2 times daily for 5 days every other week for 6 weeks.   potassium chloride (MICRO-K) 10 MEQ CR capsule 10 mEq, Oral, Daily   RESTASIS 0.05 % ophthalmic emulsion INSTILL ONE DROP IN BOTH EYES TWICE A DAY AS DIRECTED   sertraline (ZOLOFT) 25 mg, Oral, Daily   Vitamin D, Cholecalciferol, 25 MCG (1000 UT) CAPS 1 capsule, Daily    Diet Orders (From admission, onward)     Start     Ordered   10/29/23 1519  Diet regular Room service appropriate? Yes; Fluid consistency: Thin  Diet effective now       Question Answer Comment  Room service appropriate? Yes   Fluid consistency: Thin      10/29/23 1518            DVT prophylaxis: SCDs Start: 10/29/23 1519 SCDs Start: 10/28/23 2236   Lab Results  Component Value Date   PLT 190 10/29/2023      Code Status: Limited: Do not attempt resuscitation (DNR) -DNR-LIMITED -Do Not Intubate/DNI   Family Communication: Son and daughter-in-law at bedside  Status is: Inpatient  Remains inpatient appropriate because: severity of illness  Level of care: Telemetry Surgical  Consultants:  Orthopedic surgery   Objective: Vitals:   10/30/23 0422 10/30/23 0455 10/30/23 0815 10/30/23 1006  BP: (!) 71/53 102/77 (!) 80/43 (!) 80/40  Pulse: 80 86 63 86  Resp: 20  16   Temp: 97.7 F (36.5 C)  97.6 F (36.4 C) (!) 97.5 F (36.4 C)  TempSrc: Oral  Oral Axillary  SpO2: 93%  (!) 45% 97%  Height:   5' (1.524 m)     Intake/Output Summary (Last 24 hours) at 10/30/2023 1121 Last data filed at 10/30/2023 0455 Gross per 24 hour  Intake 2511.67 ml  Output 535 ml  Net 1976.67 ml   Wt Readings from Last 3 Encounters:  04/19/19 47.9 kg  04/07/19 46.9 kg  04/01/19 48.5 kg    Examination:  Constitutional: Poorly responsive Eyes: Marina Goodell orbital ecchymosis ENMT: mmm Neck: normal, supple Respiratory: Bibasilar crackles Cardiovascular: Regular rate and rhythm, no murmurs / rubs / gallops. No LE  edema. Abdomen: soft, no distention, no tenderness. Bowel sounds positive.  Skin: no rashes  Data Reviewed: I have independently reviewed following labs and imaging studies   CBC Recent Labs  Lab 10/28/23 2126 10/29/23 0630 10/29/23 1018 10/29/23 1056 10/29/23 1121 10/29/23 2357  WBC 17.8* 16.3*  --   --   --  17.4*  HGB 11.8* 11.8* 10.2* 9.9* 8.8* 9.7*  HCT 37.7 37.8 30.0* 29.0* 26.0* 32.8*  PLT 282 244  --   --   --  190  MCV 95.4 95.5  --   --   --  100.3*  MCH 29.9 29.8  --   --   --  29.7  MCHC 31.3 31.2  --   --   --  29.6*  RDW 13.6 13.8  --   --   --  14.5    Recent Labs  Lab 10/28/23 2126 10/29/23 0630 10/29/23 1018 10/29/23 1056 10/29/23  1121 10/29/23 2357  NA 138 138 141 141 144 142  K 4.1 5.0 3.7 3.5 3.3* 5.7*  CL 101 101  --   --   --  109  CO2 24 19*  --   --   --  14*  GLUCOSE 161* 213*  --   --   --  117*  BUN 36* 43*  --   --   --  45*  CREATININE 1.38* 2.10*  --   --   --  2.56*  CALCIUM 8.5* 8.9  --   --   --  7.8*  AST  --   --   --   --   --  534*  ALT  --   --   --   --   --  279*  ALKPHOS  --   --   --   --   --  105  BILITOT  --   --   --   --   --  0.8  ALBUMIN  --   --   --   --   --  3.2*  MG  --  2.4  --   --   --  2.2  HGBA1C  --  5.6  --   --   --   --     ------------------------------------------------------------------------------------------------------------------ No results for input(s): "CHOL", "HDL", "LDLCALC", "TRIG", "CHOLHDL", "LDLDIRECT" in the last 72 hours.  Lab Results  Component Value Date   HGBA1C 5.6 10/29/2023   ------------------------------------------------------------------------------------------------------------------ No results for input(s): "TSH", "T4TOTAL", "T3FREE", "THYROIDAB" in the last 72 hours.  Invalid input(s): "FREET3"  Cardiac Enzymes No results for input(s): "CKMB", "TROPONINI", "MYOGLOBIN" in the last 168 hours.  Invalid input(s):  "CK" ------------------------------------------------------------------------------------------------------------------ No results found for: "BNP"  CBG: Recent Labs  Lab 10/29/23 2356  GLUCAP 96    Recent Results (from the past 240 hours)  Surgical pcr screen     Status: Abnormal   Collection Time: 10/29/23  8:20 AM   Specimen: Nasal Mucosa; Nasal Swab  Result Value Ref Range Status   MRSA, PCR NEGATIVE NEGATIVE Final   Staphylococcus aureus POSITIVE (A) NEGATIVE Final    Comment: (NOTE) The Xpert SA Assay (FDA approved for NASAL specimens in patients 41 years of age and older), is one component of a comprehensive surveillance program. It is not intended to diagnose infection nor to guide or monitor treatment. Performed at [facility], [address], [place], [place] [address]      Radiology Studies: DG CHEST PORT 1 VIEW Result Date: 10/30/2023 CLINICAL DATA:  89 year old female with hypoxia. EXAM: PORTABLE CHEST 1 VIEW COMPARISON:  Portable chest 03/24/2019. FINDINGS: Portable AP semi upright view at 0801 hours. More rotated to the left. Confluent left lung base opacity, obscuring the left hemidiaphragm. Mediastinal contours appear stable, within normal limits. Calcified aortic atherosclerosis. Visualized tracheal air column is within normal limits. No pneumothorax, pulmonary edema. Blunting of the right costophrenic angle, mild. No air bronchograms. Scoliosis and osseous degeneration. Paucity of bowel gas. Stable cholecystectomy clips. IMPRESSION: Left greater than right lung base opacification suspected due to pleural effusions. No superimposed pulmonary edema. Aortic Atherosclerosis (ICD10-I70.0). Electronically Signed   By: Odessa Fleming M.D.   On: 10/30/2023 09:58   DG HIP PORT UNILAT W OR W/O PELVIS 1V RIGHT Result Date: 10/29/2023 CLINICAL DATA:  Fracture, postop. EXAM: DG HIP (WITH OR WITHOUT PELVIS) 1V PORT RIGHT COMPARISON:  Preoperative imaging FINDINGS: Right hip  arthroplasty in expected  alignment. No periprosthetic lucency or fracture. Recent postsurgical change includes air and edema in the soft tissues. IMPRESSION: Right hip arthroplasty without immediate postoperative complication. Electronically Signed   By: Narda Rutherford M.D.   On: 10/29/2023 16:30   DG HIP UNILAT WITH PELVIS 1V RIGHT Result Date: 10/29/2023 CLINICAL DATA:  Elective surgery. EXAM: DG HIP (WITH OR WITHOUT PELVIS) 1V RIGHT COMPARISON:  Preoperative imaging. FINDINGS: Four fluoroscopic spot views of the pelvis and right hip obtained in the operating room. Images during hip arthroplasty. Fluoroscopy time 10.5 seconds. Dose 0.6 mGy. IMPRESSION: Intraoperative fluoroscopy during right hip arthroplasty. Electronically Signed   By: Narda Rutherford M.D.   On: 10/29/2023 12:26   DG C-Arm 1-60 Min-No Report Result Date: 10/29/2023 Fluoroscopy was utilized by the requesting physician.  No radiographic interpretation.   DG C-Arm 1-60 Min-No Report Result Date: 10/29/2023 Fluoroscopy was utilized by the requesting physician.  No radiographic interpretation.    Pamella Pert, MD, PhD [facility]  Between 7 am - 7 pm I am available, please contact me via Amion (for emergencies) or Securechat (non urgent messages)  Between 7 pm - 7 am I am not available, please contact night coverage MD/APP via Amion

## 2023-10-30 NOTE — Progress Notes (Addendum)
    Patient Name: Yesenia Ramsey           DOB: 1925/04/23  MRN: [id number]      Admission Date: 10/28/2023  Attending Provider: Leatha Gilding, MD  Primary Diagnosis: Closed right hip fracture, initial encounter [facility])   Level of care: Telemetry Surgical    CROSS COVER NOTE   Date of Service   10/30/2023   [name], 89 y.o. female, was admitted on 10/28/2023 for Closed right hip fracture, initial encounter (HCC).    HPI/Events of Note   RN reporting hypotension.   Had similar episodes earlier today that required 500 cc fluid bolus, IV albumin, midodrine.  Hypotension, SBP 70-80s with MAP 60s. All other vitals stable.  Afebrile.  Patient is drowsy, but arousal with no other complaints.  Holding sedating and analgesic meds at this time.  3/20- right hip hemiarthroplasty completed, estimated blood loss 50 cc. No complications with surgical site. Obtain new CBC.  Order placed for fluid bolus, midodrine, and IV albumin.   Addendum: Improvement to BP, 91/69.  No urine output at this time.  Will start IV fluid maintenance.    Interventions/ Plan   Above        Anthoney Harada, DNP, [facility] [place]

## 2023-10-30 NOTE — Care Management Important Message (Signed)
 Important Message  Patient Details  Name: Madalen Gavin MRN: [id number] Date of Birth: 25-Jan-1925   Important Message Given:  Yes - Medicare IM     Sherilyn Banker 10/30/2023, 11:57 AM

## 2023-10-30 NOTE — IPAL (Addendum)
  Interdisciplinary Goals of Care Family Meeting   Date carried out: 10/30/2023  Location of the meeting: Bedside  Member's involved: Physician, Bedside Registered Nurse, and Other: Patient's son, daughter-in-law  Durable Power of Attorney or acting medical decision maker: Son  Discussion: We discussed goals of care for Yesenia Ramsey .  Met this morning with her son, daughter-in-law.  We discussed her hospital trajectory, following the surgery has been having persistent hypotension as well as decreased mentation and poor p.o. intake.  She received several boluses of IV fluids but now resulting in worsening respiratory status and requirement of supplemental oxygen.  She has been having worsening renal function with hyperkalemia in the setting of hypoperfusion, as well as increase liver enzymes from similar mechanism.  They tell me that she has advanced dementia at baseline, and has been having increased falls recently and overall they appreciate that her quality of life was quite poor even before this happened.  She is barely eating few bites here and there.  Son is a Orthoptist with hospice and he appears to have a very good grasp of how frail the patient is and the fact that she started to show signs of multiorgan system failure postoperatively.  She is not eating and not waking up and has persistent hypotension, renal failure, acute liver injury as well as hyperkalemia.  We have confirmed DNR/DNI, wishing to allow patient few more hours and see how she does off IV fluids, see what her blood pressure is doing, but he is inclining towards comfort if she will not show any signs of improvement.  Code status:   Code Status: Limited: Do not attempt resuscitation (DNR) -DNR-LIMITED -Do Not Intubate/DNI    Disposition: Continue current acute care, would not escalate / ICU / pressors, reevaluate in the morning  Time spent for the meeting: 20    Pamella Pert, MD  10/30/2023, 11:26 AM

## 2023-10-30 NOTE — Progress Notes (Signed)
 PT Cancellation Note  Patient Details Name: Yesenia Ramsey MRN: [id number] DOB: 1925/03/27   Cancelled Treatment:    Reason Eval/Treat Not Completed: PT screened, no needs identified, will sign off - per family, pt remains minimally arousable post-surgery, currently is a red MEWS, family states plan is for comfort care. PT to sign off, please reconsult if pt status changes.   Marye Round, PT DPT Acute Rehabilitation Services Secure Chat Preferred  Office [phone]    Truddie Coco 10/30/2023, 11:17 AM

## 2023-10-30 NOTE — Progress Notes (Signed)
 Pt status remained remained unchanged for BP. Paged on call Anthoney Harada NP after reassessment. See new orders. Rapid Response nurse was paged and came to bedside to assess pt. Recommended continue of care as directed by [facility] NP.  Medication are administered as ordered except oral medication because cannot tolerate oral intake due to lethargic. Will continue to monitor pt.

## 2023-10-30 NOTE — Progress Notes (Signed)
   10/30/23 1006  Vitals  Temp (!) 97.5 F (36.4 C)  Temp Source Axillary  BP (!) 80/40  MAP (mmHg) (!) 53  BP Location Right Arm  BP Method Automatic  Patient Position (if appropriate) Lying  Pulse Rate 86  Pulse Rate Source Monitor  Level of Consciousness  Level of Consciousness Unresponsive  MEWS COLOR  MEWS Score Color Red  Oxygen Therapy  SpO2 97 %  O2 Device Nasal Cannula  O2 Flow Rate (L/min) 4 L/min  Pain Assessment  Faces Pain Scale 0  MEWS Score  MEWS Temp 0  MEWS Systolic 2  MEWS Pulse 0  MEWS RR 0  MEWS LOC 3  MEWS Score 5  Provider Notification  Provider Name/Title Pamella Pert MD  Date Provider Notified 10/30/23  Time Provider Notified 1015  Method of Notification Face-to-face  Notification Reason  (RED MEWS)  Provider response See new orders  Date of Provider Response 10/30/23  Time of Provider Response 1015   Dr. Elvera Lennox at bedside, family at bedside. Dr. Elvera Lennox updated family.

## 2023-10-31 ENCOUNTER — Other Ambulatory Visit (HOSPITAL_COMMUNITY): Payer: Self-pay

## 2023-10-31 DIAGNOSIS — S72001A Fracture of unspecified part of neck of right femur, initial encounter for closed fracture: Secondary | ICD-10-CM | POA: Diagnosis not present

## 2023-10-31 LAB — MAGNESIUM: Magnesium: 2.3 mg/dL (ref 1.7–2.4)

## 2023-10-31 LAB — COMPREHENSIVE METABOLIC PANEL
ALT: 75 U/L — ABNORMAL HIGH (ref 0–44)
AST: 2377 U/L — ABNORMAL HIGH (ref 15–41)
Albumin: 3.6 g/dL (ref 3.5–5.0)
Alkaline Phosphatase: 125 U/L (ref 38–126)
Anion gap: 13 (ref 5–15)
BUN: 68 mg/dL — ABNORMAL HIGH (ref 8–23)
CO2: 20 mmol/L — ABNORMAL LOW (ref 22–32)
Calcium: 8.4 mg/dL — ABNORMAL LOW (ref 8.9–10.3)
Chloride: 109 mmol/L (ref 98–111)
Creatinine, Ser: 4.05 mg/dL — ABNORMAL HIGH (ref 0.44–1.00)
GFR, Estimated: 10 mL/min — ABNORMAL LOW (ref >60)
Glucose, Bld: 121 mg/dL — ABNORMAL HIGH (ref 70–99)
Potassium: 5.3 mmol/L — ABNORMAL HIGH (ref 3.5–5.1)
Sodium: 142 mmol/L (ref 135–145)
Total Bilirubin: 0.8 mg/dL (ref 0.0–1.2)
Total Protein: 6 g/dL — ABNORMAL LOW (ref 6.5–8.1)

## 2023-10-31 LAB — CBC
HCT: 29.9 % — ABNORMAL LOW (ref 36.0–46.0)
Hemoglobin: 9.5 g/dL — ABNORMAL LOW (ref 12.0–15.0)
MCH: 29.7 pg (ref 26.0–34.0)
MCHC: 31.8 g/dL (ref 30.0–36.0)
MCV: 93.4 fL (ref 80.0–100.0)
Platelets: 152 10*3/uL (ref 150–400)
RBC: 3.2 MIL/uL — ABNORMAL LOW (ref 3.87–5.11)
RDW: 14.8 % (ref 11.5–15.5)
WBC: 17.2 10*3/uL — ABNORMAL HIGH (ref 4.0–10.5)
nRBC: 0.2 % (ref 0.0–0.2)

## 2023-10-31 MED ORDER — HALOPERIDOL 0.5 MG PO TABS: 0.5000 mg | ORAL_TABLET | ORAL | Status: DC | PRN

## 2023-10-31 MED ORDER — LORAZEPAM 2 MG/ML IJ SOLN: 1.0000 mg | INTRAMUSCULAR | Status: DC | PRN

## 2023-10-31 MED ORDER — BIOTENE DRY MOUTH MT LIQD: 15.0000 mL | OROMUCOSAL | Status: DC | PRN

## 2023-10-31 MED ORDER — HALOPERIDOL LACTATE 2 MG/ML PO CONC: 0.5000 mg | ORAL | Status: DC | PRN

## 2023-10-31 MED ORDER — LORAZEPAM 2 MG/ML PO CONC: 1.0000 mg | ORAL | Status: DC | PRN

## 2023-10-31 MED ORDER — ONDANSETRON 4 MG PO TBDP: 4.0000 mg | ORAL_TABLET | Freq: Four times a day (QID) | ORAL | Status: DC | PRN

## 2023-10-31 MED ORDER — HYDROMORPHONE HCL 1 MG/ML IJ SOLN: 0.5000 mg | INTRAMUSCULAR | Status: DC | PRN

## 2023-10-31 MED ORDER — ACETAMINOPHEN 325 MG PO TABS: 650.0000 mg | ORAL_TABLET | Freq: Four times a day (QID) | ORAL | Status: DC | PRN

## 2023-10-31 MED ORDER — GLYCOPYRROLATE 0.2 MG/ML IJ SOLN: 0.2000 mg | INTRAMUSCULAR | Status: DC | PRN

## 2023-10-31 MED ORDER — HALOPERIDOL LACTATE 5 MG/ML IJ SOLN: 0.5000 mg | INTRAMUSCULAR | Status: DC | PRN

## 2023-10-31 MED ORDER — ONDANSETRON HCL 4 MG/2ML IJ SOLN: 4.0000 mg | Freq: Four times a day (QID) | INTRAMUSCULAR | Status: DC | PRN

## 2023-10-31 MED ORDER — ACETAMINOPHEN 650 MG RE SUPP: 650.0000 mg | Freq: Four times a day (QID) | RECTAL | Status: DC | PRN

## 2023-10-31 MED ORDER — GLYCOPYRROLATE 1 MG PO TABS: 1.0000 mg | ORAL_TABLET | ORAL | Status: DC | PRN

## 2023-10-31 MED ORDER — LORAZEPAM 1 MG PO TABS: 1.0000 mg | ORAL_TABLET | ORAL | Status: DC | PRN

## 2023-10-31 MED ORDER — POLYVINYL ALCOHOL 1.4 % OP SOLN: 1.0000 [drp] | Freq: Four times a day (QID) | OPHTHALMIC | Status: DC | PRN

## 2023-10-31 NOTE — Progress Notes (Signed)
 Orthopaedic Trauma Progress Note  S: Sleeping, does not indicate pain. Patient has been transferred to comfort care per family.   O:  Vitals:   10/31/23 0821 10/31/23 0855  BP: (!) 86/55 (!) 114/38  Pulse: (!) 107   Resp: 18   Temp: (!) 97.5 F (36.4 C) 97.9 F (36.6 C)  SpO2: 94%     NAD Sleeping GEX:[id number] clean and dry, Swelling to leg appropriate. Not following commands currently  Imaging: Stable post op imaging  Labs:  Results for orders placed or performed during the hospital encounter of 10/28/23 (from the past 24 hours)  CBC     Status: Abnormal   Collection Time: 10/31/23  7:50 AM  Result Value Ref Range   WBC 17.2 (H) 4.0 - 10.5 K/uL   RBC 3.20 (L) 3.87 - 5.11 MIL/uL   Hemoglobin 9.5 (L) 12.0 - 15.0 g/dL   HCT 40.1 (L) 02.7 - 25.3 %   MCV 93.4 80.0 - 100.0 fL   MCH 29.7 26.0 - 34.0 pg   MCHC 31.8 30.0 - 36.0 g/dL   RDW 66.4 40.3 - 47.4 %   Platelets 152 150 - 400 K/uL   nRBC 0.2 0.0 - 0.2 %  Comprehensive metabolic panel     Status: Abnormal   Collection Time: 10/31/23  7:50 AM  Result Value Ref Range   Sodium 142 135 - 145 mmol/L   Potassium 5.3 (H) 3.5 - 5.1 mmol/L   Chloride 109 98 - 111 mmol/L   CO2 20 (L) 22 - 32 mmol/L   Glucose, Bld 121 (H) 70 - 99 mg/dL   BUN 68 (H) 8 - 23 mg/dL   Creatinine, Ser 2.59 (H) 0.44 - 1.00 mg/dL   Calcium 8.4 (L) 8.9 - 10.3 mg/dL   Total Protein 6.0 (L) 6.5 - 8.1 g/dL   Albumin 3.6 3.5 - 5.0 g/dL   AST 5,638 (H) 15 - 41 U/L   ALT 75 (H) 0 - 44 U/L   Alkaline Phosphatase 125 38 - 126 U/L   Total Bilirubin 0.8 0.0 - 1.2 mg/dL   GFR, Estimated 10 (L) >60 mL/min   Anion gap 13 5 - 15  Magnesium     Status: None   Collection Time: 10/31/23  7:50 AM  Result Value Ref Range   Magnesium 2.3 1.7 - 2.4 mg/dL    Assessment: 89 yo F s/p R hip hemiarthroplasty for femoral neck fracture Weightbearing: WBAT no hip precautions  Will defer further medication management per medicine for comfort care/hospice.   Janine Ores, PA-C

## 2023-10-31 NOTE — Progress Notes (Signed)
 PROGRESS NOTE  Yesenia Ramsey ZOX:[id number] DOB: 02/25/1925 DOA: 10/28/2023 PCP: Thana Ates, MD   LOS: 3 days   Brief Narrative / Interim history: 89 y.o. female with medical history significant for advanced dementia, frequent falls, chronic anxiety/depression, CKD 3B, who presents to the ER from home after a mechanical fall. In the ER, noted to have hematoma and laceration to right side of head. Left eye is swollen shut from previous fall. Imaging revealed right acute subcapital proximal right femoral neck fracture with superior displacement, mild angulation and internal rotation. Orthopedic surgery consulted and she was admitted to the hospital.  She underwent operative repair on 3/20, unfortunately postoperative course complicated by persistent hypotension, encephalopathy, poor p.o. intake and multiorgan system failure  Subjective / 24h Interval events: Remains poorly responsive  Assesement and Plan: Principal problem Right hip fracture, post mechanical fall -orthopedic surgery consulted, appreciate input.  She was taken to the OR on 3/20 status post right hip hemiarthroplasty.    Discussed again goals of care today, she seems to be declining following surgery, remains poorly responsive and noninteractive, has persistent renal failure as well as her liver injury seems to be getting worse.  Given multiorgan system failure, will switch to full comfort, family prefers placement to become, reached out to [facility]  Active problems  AKI on CKD 3B Hyperkalemia Acute liver injury, elevated liver enzymes  Hypotension Acute hypoxic respiratory failure  Leukocytosis  Hyperglycemia Chronic anxiety/depression  Severe protein calorie malnutrition    Scheduled Meds:  Continuous Infusions:  PRN Meds:.acetaminophen **OR** acetaminophen, antiseptic oral rinse, glycopyrrolate **OR** glycopyrrolate **OR** glycopyrrolate, haloperidol **OR** haloperidol **OR** haloperidol lactate, HYDROmorphone (DILAUDID)  injection, LORazepam **OR** LORazepam **OR** LORazepam, ondansetron **OR** ondansetron (ZOFRAN) IV, polyvinyl alcohol  Current Outpatient Medications  Medication Instructions   acetaminophen (TYLENOL) 500 mg, Every 6 hours PRN   bimatoprost (LUMIGAN) 0.03 % ophthalmic solution 1 drop, Daily at bedtime   bisoprolol (ZEBETA) 10 mg, Oral, Daily   chlorhexidine (HIBICLENS) 4 % external liquid 1 Application, Topical, As directed, Use as directed daily for 5 days every other week for 6 weeks.   Coenzyme Q10 (COQ-10) 100 MG CAPS Take by mouth.   cyanocobalamin 3,000 mcg, 2 times daily   furosemide (LASIX) 40 mg, Oral, Daily   latanoprost (XALATAN) 0.005 % ophthalmic solution 1 drop, Daily at bedtime   LORazepam (ATIVAN) 0.5 mg, Oral, Daily at bedtime   Multiple Vitamins-Minerals (ICAPS AREDS FORMULA PO) 1 capsule, 2 times daily   mupirocin ointment (BACTROBAN) 2 % 1 Application, Nasal, 2 times daily, Use as directed 2 times daily for 5 days every other week for 6 weeks.   potassium chloride (MICRO-K) 10 MEQ CR capsule 10 mEq, Oral, Daily   RESTASIS 0.05 % ophthalmic emulsion INSTILL ONE DROP IN BOTH EYES TWICE A DAY AS DIRECTED   sertraline (ZOLOFT) 25 mg, Oral, Daily   Vitamin D, Cholecalciferol, 25 MCG (1000 UT) CAPS 1 capsule, Daily    Diet Orders (From admission, onward)     Start     Ordered   10/29/23 1519  Diet regular Room service appropriate? Yes; Fluid consistency: Thin  Diet effective now       Question Answer Comment  Room service appropriate? Yes   Fluid consistency: Thin      10/29/23 1518            DVT prophylaxis:    Lab Results  Component Value Date   PLT 152 10/31/2023      Code Status: Do  not attempt resuscitation (DNR) - Comfort care  Family Communication: Son and daughter-in-law at bedside  Status is: Inpatient  Remains inpatient appropriate because: severity of illness  Level of care: Telemetry Surgical  Consultants:  Orthopedic surgery    Objective: Vitals:   10/30/23 2205 10/31/23 0407 10/31/23 0821 10/31/23 0855  BP: (!) 97/41 109/60 (!) 86/55 (!) 114/38  Pulse: (!) 105 (!) 110 (!) 107   Resp: 20 18 18    Temp: 97.7 F (36.5 C)  (!) 97.5 F (36.4 C) 97.9 F (36.6 C)  TempSrc: Oral  Axillary Axillary  SpO2: 92% 91% 94%   Height:        Intake/Output Summary (Last 24 hours) at 10/31/2023 1058 Last data filed at 10/30/2023 1300 Gross per 24 hour  Intake 0 ml  Output --  Net 0 ml   Wt Readings from Last 3 Encounters:  04/19/19 47.9 kg  04/07/19 46.9 kg  04/01/19 48.5 kg    Examination:  Constitutional: Poorly responsive  Data Reviewed: I have independently reviewed following labs and imaging studies   CBC Recent Labs  Lab 10/28/23 2126 10/29/23 0630 10/29/23 1018 10/29/23 1056 10/29/23 1121 10/29/23 2357 10/31/23 0750  WBC 17.8* 16.3*  --   --   --  17.4* 17.2*  HGB 11.8* 11.8* 10.2* 9.9* 8.8* 9.7* 9.5*  HCT 37.7 37.8 30.0* 29.0* 26.0* 32.8* 29.9*  PLT 282 244  --   --   --  190 152  MCV 95.4 95.5  --   --   --  100.3* 93.4  MCH 29.9 29.8  --   --   --  29.7 29.7  MCHC 31.3 31.2  --   --   --  29.6* 31.8  RDW 13.6 13.8  --   --   --  14.5 14.8    Recent Labs  Lab 10/28/23 2126 10/29/23 0630 10/29/23 1018 10/29/23 1056 10/29/23 1121 10/29/23 2357 10/31/23 0750  NA 138 138 141 141 144 142 142  K 4.1 5.0 3.7 3.5 3.3* 5.7* 5.3*  CL 101 101  --   --   --  109 109  CO2 24 19*  --   --   --  14* 20*  GLUCOSE 161* 213*  --   --   --  117* 121*  BUN 36* 43*  --   --   --  45* 68*  CREATININE 1.38* 2.10*  --   --   --  2.56* 4.05*  CALCIUM 8.5* 8.9  --   --   --  7.8* 8.4*  AST  --   --   --   --   --  534* 2,377*  ALT  --   --   --   --   --  279* 75*  ALKPHOS  --   --   --   --   --  105 125  BILITOT  --   --   --   --   --  0.8 0.8  ALBUMIN  --   --   --   --   --  3.2* 3.6  MG  --  2.4  --   --   --  2.2 2.3  HGBA1C  --  5.6  --   --   --   --   --      ------------------------------------------------------------------------------------------------------------------ No results for input(s): "CHOL", "HDL", "LDLCALC", "TRIG", "CHOLHDL", "LDLDIRECT" in the last 72 hours.  Lab Results  Component Value  Date   HGBA1C 5.6 10/29/2023   ------------------------------------------------------------------------------------------------------------------ No results for input(s): "TSH", "T4TOTAL", "T3FREE", "THYROIDAB" in the last 72 hours.  Invalid input(s): "FREET3"  Cardiac Enzymes No results for input(s): "CKMB", "TROPONINI", "MYOGLOBIN" in the last 168 hours.  Invalid input(s): "CK" ------------------------------------------------------------------------------------------------------------------ No results found for: "BNP"  CBG: Recent Labs  Lab 10/29/23 2356  GLUCAP 96    Recent Results (from the past 240 hours)  Surgical pcr screen     Status: Abnormal   Collection Time: 10/29/23  8:20 AM   Specimen: Nasal Mucosa; Nasal Swab  Result Value Ref Range Status   MRSA, PCR NEGATIVE NEGATIVE Final   Staphylococcus aureus POSITIVE (A) NEGATIVE Final    Comment: (NOTE) The Xpert SA Assay (FDA approved for NASAL specimens in patients 28 years of age and older), is one component of a comprehensive surveillance program. It is not intended to diagnose infection nor to guide or monitor treatment. Performed at [facility], [address] [place], [place] [address]      Radiology Studies: No results found.   Pamella Pert, MD, PhD [facility]  Between 7 am - 7 pm I am available, please contact me via Amion (for emergencies) or Securechat (non urgent messages)  Between 7 pm - 7 am I am not available, please contact night coverage MD/APP via Amion

## 2023-10-31 NOTE — TOC Transition Note (Signed)
 Transition of Care [facility]) - Discharge Note   Patient Details  Name: Yesenia Ramsey MRN: [id number] Date of Birth: 1924-12-30  [facility] CM/SW Contact:  Jimmy Picket, LCSW Phone Number: 10/31/2023, 3:46 PM   Clinical Narrative:    Per MD patient ready for DC to [facility].  Ambulance transport requested for patient.   Please refer to [facility]'s note for details.     CSW will sign off for now as social work intervention is no longer needed. Please consult [place] again if new needs arise.    Final next level of care: Hospice Medical Facility Barriers to Discharge: Barriers Resolved   Patient Goals and CMS Choice            Discharge Placement              Patient chooses bed at: Other - please specify in the comment section below: [facility]) Patient to be transferred to facility by: [facility]tar Name of family member notified: son Patient and family notified of of transfer: 10/31/23  Discharge Plan and Services Additional resources added to the After Visit Summary for                                       Social Drivers of Health (SDOH) Interventions SDOH Screenings   Food Insecurity: No Food Insecurity (10/30/2023)  Housing: Low Risk  (10/30/2023)  Transportation Needs: No Transportation Needs (10/30/2023)  Utilities: Not At Risk (10/30/2023)  Social Connections: Socially Isolated (10/30/2023)  Tobacco Use: Low Risk  (10/29/2023)     Readmission Risk Interventions     No data to display

## 2023-10-31 NOTE — Discharge Summary (Signed)
 Physician Discharge Summary  Cristela Swayze ZOX:[id number] DOB: [birth year] DOA: 10/28/2023  PCP: Thana Ates, MD  Admit date: 10/28/2023 Discharge date: 10/31/2023  Admitted From: home Disposition:  residential hospice  Discharge Condition: stable CODE STATUS: DNR  HPI: Per admitting MD,  Yesenia Ramsey is a 89 y.o. female with medical history significant for advanced dementia, frequent falls, chronic anxiety/depression, CKD 3B, who presents to the ER from home after a mechanical fall earlier today.  The patient was getting out of her car and tripped.  No loss of consciousness.  Prior to this event had another fall 1 day before yesterday when she lost her balance.  EMS was activated and patient was brought into the ER for further evaluation. In the ER, noted to have hematoma and laceration to right side of head.  Left eye is swollen shut from previous fall.  Imaging revealed right acute subcapital proximal right femoral neck fracture with superior displacement, mild angulation and internal rotation.  Moderate compression fracture of T5 vertebral body age-indeterminate.  EDP discussed the case with orthopedic surgery Dr. Carola Frost.  Possible surgical intervention on 10/29/2023 afternoon. Per the patient's son at bedside, she had been increasedly more confused for the past 2 weeks.  Hospital Course / Discharge diagnoses: Principal problem Right hip fracture, post mechanical fall -orthopedic surgery consulted, appreciate input.  She was taken to the OR on 3/20 status post right hip hemiarthroplasty.     Discussed again goals of care today with the family, she seems to be declining following surgery, remains poorly responsive and noninteractive, has persistent renal failure as well as her liver injury seems to be getting worse.  Given multiorgan system failure, advanced dementia, poor quality of life, will switch to full comfort   Active problems  AKI on CKD 3B Hyperkalemia Acute liver injury, elevated  liver enzymes  Hypotension Acute hypoxic respiratory failure  Leukocytosis  Hyperglycemia Chronic anxiety/depression  Severe protein calorie malnutrition   Sepsis ruled out   Discharge Instructions   Allergies as of 10/31/2023       Reactions   Cat Dander Cough, Shortness Of Breath   Amlodipine Other (See Comments)   Codeine Other (See Comments)   Penicillins Other (See Comments)   Risedronate Other (See Comments)   Sulfa Antibiotics Other (See Comments)        Medication List     STOP taking these medications    bimatoprost 0.03 % ophthalmic solution Commonly known as: LUMIGAN   bisoprolol 10 MG tablet Commonly known as: ZEBETA   CoQ-10 100 MG Caps   cyanocobalamin 1000 MCG tablet   furosemide 40 MG tablet Commonly known as: LASIX   ICAPS AREDS FORMULA PO   latanoprost 0.005 % ophthalmic solution Commonly known as: XALATAN   potassium chloride 10 MEQ CR capsule Commonly known as: MICRO-K   sertraline 25 MG tablet Commonly known as: ZOLOFT   Vitamin D (Cholecalciferol) 25 MCG (1000 UT) Caps       TAKE these medications    acetaminophen 500 MG tablet Commonly known as: TYLENOL Take 500 mg by mouth every 6 (six) hours as needed.   LORazepam 0.5 MG tablet Commonly known as: ATIVAN Take 0.5 mg by mouth at bedtime.   Restasis 0.05 % ophthalmic emulsion Generic drug: cycloSPORINE INSTILL ONE DROP IN BOTH EYES TWICE A DAY AS DIRECTED       Consultations: Orthopedic surgery  Procedures/Studies:  DG CHEST PORT 1 VIEW Result Date: 10/30/2023 CLINICAL DATA:  89 year old female with  hypoxia. EXAM: PORTABLE CHEST 1 VIEW COMPARISON:  Portable chest 03/24/2019. FINDINGS: Portable AP semi upright view at 0801 hours. More rotated to the left. Confluent left lung base opacity, obscuring the left hemidiaphragm. Mediastinal contours appear stable, within normal limits. Calcified aortic atherosclerosis. Visualized tracheal air column is within normal  limits. No pneumothorax, pulmonary edema. Blunting of the right costophrenic angle, mild. No air bronchograms. Scoliosis and osseous degeneration. Paucity of bowel gas. Stable cholecystectomy clips. IMPRESSION: Left greater than right lung base opacification suspected due to pleural effusions. No superimposed pulmonary edema. Aortic Atherosclerosis (ICD10-I70.0). Electronically Signed   By: Odessa Fleming M.D.   On: 10/30/2023 09:58   DG HIP PORT UNILAT W OR W/O PELVIS 1V RIGHT Result Date: 10/29/2023 CLINICAL DATA:  Fracture, postop. EXAM: DG HIP (WITH OR WITHOUT PELVIS) 1V PORT RIGHT COMPARISON:  Preoperative imaging FINDINGS: Right hip arthroplasty in expected alignment. No periprosthetic lucency or fracture. Recent postsurgical change includes air and edema in the soft tissues. IMPRESSION: Right hip arthroplasty without immediate postoperative complication. Electronically Signed   By: Narda Rutherford M.D.   On: 10/29/2023 16:30   DG HIP UNILAT WITH PELVIS 1V RIGHT Result Date: 10/29/2023 CLINICAL DATA:  Elective surgery. EXAM: DG HIP (WITH OR WITHOUT PELVIS) 1V RIGHT COMPARISON:  Preoperative imaging. FINDINGS: Four fluoroscopic spot views of the pelvis and right hip obtained in the operating room. Images during hip arthroplasty. Fluoroscopy time 10.5 seconds. Dose 0.6 mGy. IMPRESSION: Intraoperative fluoroscopy during right hip arthroplasty. Electronically Signed   By: Narda Rutherford M.D.   On: 10/29/2023 12:26   DG C-Arm 1-60 Min-No Report Result Date: 10/29/2023 Fluoroscopy was utilized by the requesting physician.  No radiographic interpretation.   DG C-Arm 1-60 Min-No Report Result Date: 10/29/2023 Fluoroscopy was utilized by the requesting physician.  No radiographic interpretation.   DG Knee Complete 4 Views Right Result Date: 10/28/2023 CLINICAL DATA:  Tripped, fell, right hip fracture, pain EXAM: RIGHT KNEE - COMPLETE 4+ VIEW COMPARISON:  None Available. FINDINGS: Frontal, bilateral oblique,  and lateral views of the right knee are obtained. No acute fracture, subluxation, or dislocation. Mild 3 compartmental osteoarthritis greatest in the patellofemoral compartment. No joint effusion. Soft tissues are unremarkable. IMPRESSION: 1. No acute displaced fracture. 2. Mild 3 compartmental osteoarthritis, greatest in the patellofemoral compartment. Electronically Signed   By: Sharlet Salina M.D.   On: 10/28/2023 21:34   DG Hip Unilat W or Wo Pelvis 2-3 Views Right Result Date: 10/28/2023 CLINICAL DATA:  Fall  with right hip fracture. EXAM: DG HIP (WITH OR WITHOUT PELVIS) 2-3V RIGHT COMPARISON:  AP pelvis 03/24/2019. FINDINGS: Generalized osteopenia. AP pelvis without evidence of displaced pelvic fracture or diastasis. There is spurring of the SI joints and pubic symphysis without widening. There is an acute subcapital proximal right femoral neck closed fracture. The distal fragment is superiorly displaced 2 cm with mild medial and ventral angulation and internal rotation. There is mild nonerosive arthrosis at both hips. Visualized proximal left femur is intact. Soft tissues are unremarkable apart from vascular calcifications. Pronounced lumbar levoscoliosis partially visible. IMPRESSION: 1. Acute subcapital proximal right femoral neck fracture with superior displacement, mild angulation and internal rotation. 2. Osteopenia and degenerative change. 3. Lumbar levoscoliosis partially visible. Electronically Signed   By: Almira Bar M.D.   On: 10/28/2023 20:21   CT Cervical Spine Wo Contrast Result Date: 10/28/2023 CLINICAL DATA:  Neck trauma (Age >= 65y).  Fall. EXAM: CT CERVICAL SPINE WITHOUT CONTRAST TECHNIQUE: Multidetector CT imaging of the cervical  spine was performed without intravenous contrast. Multiplanar CT image reconstructions were also generated. RADIATION DOSE REDUCTION: This exam was performed according to the departmental dose-optimization program which includes automated exposure control,  adjustment of the mA and/or kV according to patient size and/or use of iterative reconstruction technique. COMPARISON:  03/24/2019 FINDINGS: Alignment: No subluxation Skull base and vertebrae: Moderate compression fracture involving the T5 vertebral body, age indeterminate. No visible additional fracture or focal bone lesion. Soft tissues and spinal canal: No prevertebral fluid or swelling. No visible canal hematoma. Disc levels: Degenerative disc disease most pronounced at C5-6 and C6-7. Mild-to-moderate degenerative facet disease bilaterally, left greater than right. Upper chest: No acute findings.  Biapical scarring. Other: None IMPRESSION: Degenerative disc and facet disease. Moderate compression fracture involving the T5 vertebral body, age indeterminate. Electronically Signed   By: Charlett Nose M.D.   On: 10/28/2023 20:06   CT Maxillofacial Wo Contrast Result Date: 10/28/2023 CLINICAL DATA:  Facial trauma, blunt.  Fall. EXAM: CT MAXILLOFACIAL WITHOUT CONTRAST TECHNIQUE: Multidetector CT imaging of the maxillofacial structures was performed. Multiplanar CT image reconstructions were also generated. RADIATION DOSE REDUCTION: This exam was performed according to the departmental dose-optimization program which includes automated exposure control, adjustment of the mA and/or kV according to patient size and/or use of iterative reconstruction technique. COMPARISON:  None Available. FINDINGS: Osseous: Image quality is motion degraded. No visible facial fracture. Zygomatic arches and mandible appear intact. Orbits: Negative. No traumatic or inflammatory finding. Sinuses: Clear Soft tissues: Soft tissue swelling in the right forehead. Limited intracranial: See head CT report IMPRESSION: No facial or orbital fracture. Electronically Signed   By: Charlett Nose M.D.   On: 10/28/2023 20:03   CT Head Wo Contrast Result Date: 10/28/2023 CLINICAL DATA:  Head trauma, minor (Age >= 65y).  Fall. EXAM: CT HEAD WITHOUT  CONTRAST TECHNIQUE: Contiguous axial images were obtained from the base of the skull through the vertex without intravenous contrast. RADIATION DOSE REDUCTION: This exam was performed according to the departmental dose-optimization program which includes automated exposure control, adjustment of the mA and/or kV according to patient size and/or use of iterative reconstruction technique. COMPARISON:  03/24/2019 FINDINGS: Brain: There is atrophy and chronic small vessel disease changes. No acute intracranial abnormality. Specifically, no hemorrhage, hydrocephalus, mass lesion, acute infarction, or significant intracranial injury. Vascular: No hyperdense vessel or unexpected calcification. Skull: No acute calvarial abnormality. Sinuses/Orbits: No acute findings Other: Soft tissue swelling in the right forehead. IMPRESSION: Atrophy, chronic microvascular disease. No acute intracranial abnormality. Electronically Signed   By: Charlett Nose M.D.   On: 10/28/2023 20:02     Subjective: Poorly responsive  Discharge Exam: BP (!) 114/38 (BP Location: Left Leg)   Pulse (!) 107   Temp 97.9 F (36.6 C) (Axillary)   Resp 18   Ht 5' (1.524 m)   SpO2 94%   BMI 20.62 kg/m   General: unresponsive   The results of significant diagnostics from this hospitalization (including imaging, microbiology, ancillary and laboratory) are listed below for reference.     Microbiology: Recent Results (from the past 240 hours)  Surgical pcr screen     Status: Abnormal   Collection Time: 10/29/23  8:20 AM   Specimen: Nasal Mucosa; Nasal Swab  Result Value Ref Range Status   MRSA, PCR NEGATIVE NEGATIVE Final   Staphylococcus aureus POSITIVE (A) NEGATIVE Final    Comment: (NOTE) The Xpert SA Assay (FDA approved for NASAL specimens in patients 16 years of age and older), is one  component of a comprehensive surveillance program. It is not intended to diagnose infection nor to guide or monitor treatment. Performed at [facility], [address], [place], [place] [address]      Labs: Basic Metabolic Panel: Recent Labs  Lab 10/28/23 2126 10/29/23 0630 10/29/23 1018 10/29/23 1056 10/29/23 1121 10/29/23 2357 10/31/23 0750  NA 138 138 141 141 144 142 142  K 4.1 5.0 3.7 3.5 3.3* 5.7* 5.3*  CL 101 101  --   --   --  109 109  CO2 24 19*  --   --   --  14* 20*  GLUCOSE 161* 213*  --   --   --  117* 121*  BUN 36* 43*  --   --   --  45* 68*  CREATININE 1.38* 2.10*  --   --   --  2.56* 4.05*  CALCIUM 8.5* 8.9  --   --   --  7.8* 8.4*  MG  --  2.4  --   --   --  2.2 2.3  PHOS  --  6.7*  --   --   --  6.7*  --    Liver Function Tests: Recent Labs  Lab 10/29/23 2357 10/31/23 0750  AST 534* 2,377*  ALT 279* 75*  ALKPHOS 105 125  BILITOT 0.8 0.8  PROT 5.5* 6.0*  ALBUMIN 3.2* 3.6   CBC: Recent Labs  Lab 10/28/23 2126 10/29/23 0630 10/29/23 1018 10/29/23 1056 10/29/23 1121 10/29/23 2357 10/31/23 0750  WBC 17.8* 16.3*  --   --   --  17.4* 17.2*  HGB 11.8* 11.8* 10.2* 9.9* 8.8* 9.7* 9.5*  HCT 37.7 37.8 30.0* 29.0* 26.0* 32.8* 29.9*  MCV 95.4 95.5  --   --   --  100.3* 93.4  PLT 282 244  --   --   --  190 152   CBG: Recent Labs  Lab 10/29/23 2356  GLUCAP 96   Hgb A1c Recent Labs    10/29/23 0630  HGBA1C 5.6   Lipid Profile No results for input(s): "CHOL", "HDL", "LDLCALC", "TRIG", "CHOLHDL", "LDLDIRECT" in the last 72 hours. Thyroid function studies No results for input(s): "TSH", "T4TOTAL", "T3FREE", "THYROIDAB" in the last 72 hours.  Invalid input(s): "FREET3" Urinalysis    Component Value Date/Time   COLORURINE YELLOW 10/29/2023 1751   APPEARANCEUR HAZY (A) 10/29/2023 1751   LABSPEC 1.012 10/29/2023 1751   PHURINE 5.0 10/29/2023 1751   GLUCOSEU NEGATIVE 10/29/2023 1751   HGBUR NEGATIVE 10/29/2023 1751   BILIRUBINUR NEGATIVE 10/29/2023 1751   KETONESUR NEGATIVE 10/29/2023 1751   PROTEINUR 30 (A) 10/29/2023 1751   NITRITE NEGATIVE 10/29/2023 1751   LEUKOCYTESUR  NEGATIVE 10/29/2023 1751    FURTHER DISCHARGE INSTRUCTIONS:   Get Medicines reviewed and adjusted: Please take all your medications with you for your next visit with your Primary MD   Laboratory/radiological data: Please request your Primary MD to go over all hospital tests and procedure/radiological results at the follow up, please ask your Primary MD to get all Hospital records sent to his/her office.   In some cases, they will be blood work, cultures and biopsy results pending at the time of your discharge. Please request that your primary care M.D. goes through all the records of your hospital data and follows up on these results.   Also Note the following: If you experience worsening of your admission symptoms, develop shortness of breath, life threatening emergency, suicidal or homicidal thoughts you must  seek medical attention immediately by calling 911 or calling your MD immediately  if symptoms less severe.   You must read complete instructions/literature along with all the possible adverse reactions/side effects for all the Medicines you take and that have been prescribed to you. Take any new Medicines after you have completely understood and accpet all the possible adverse reactions/side effects.    Do not drive when taking Pain medications or sleeping medications (Benzodaizepines)   Do not take more than prescribed Pain, Sleep and Anxiety Medications. It is not advisable to combine anxiety,sleep and pain medications without talking with your primary care practitioner   Special Instructions: If you have smoked or chewed Tobacco  in the last 2 yrs please stop smoking, stop any regular Alcohol  and or any Recreational drug use.   Wear Seat belts while driving.   Please note: You were cared for by a hospitalist during your hospital stay. Once you are discharged, your primary care physician will handle any further medical issues. Please note that NO REFILLS for any discharge  medications will be authorized once you are discharged, as it is imperative that you return to your primary care physician (or establish a relationship with a primary care physician if you do not have one) for your post hospital discharge needs so that they can reassess your need for medications and monitor your lab values.  Time coordinating discharge: 35 minutes  SIGNED:  Pamella Pert, MD, PhD 10/31/2023, 11:02 AM

## 2023-10-31 NOTE — Progress Notes (Signed)
 Report called to Lillia Abed, Charity fundraiser at [facility]. All questions answered.

## 2023-10-31 NOTE — Progress Notes (Signed)
 Yesenia Ramsey [phone]  [facility] Liaison Note  Referral received from [facility] for family interest in [facility].   Met with patient and family in room to explain services and hospice philosophy and all questions answered.   [facility] is able to accept patient this afternoon once consents are complete.   RN staff, you may call report at any time to [facility] @ [phone], room is assigned when report is called.   Please leave IV intact and send completed DNR with patient.   Updated attending and [facility] manager via [employer].  Thank you for the opportunity to participate in this patient's care  Roe Rutherford, BSN, RN  Hospice Nurse Liaison  [phone]

## 2023-10-31 NOTE — Progress Notes (Signed)
 [facility] arrived and transported pt off of unit to take her to [facility]. Pt's son at bedside.

## 2023-10-31 NOTE — Plan of Care (Signed)
  Problem: Education: Goal: Knowledge of General Education information will improve Description: Including pain rating scale, medication(s)/side effects and non-pharmacologic comfort measures Outcome: Adequate for Discharge   Problem: Health Behavior/Discharge Planning: Goal: Ability to manage health-related needs will improve Outcome: Adequate for Discharge   Problem: Clinical Measurements: Goal: Ability to maintain clinical measurements within normal limits will improve Outcome: Adequate for Discharge Goal: Will remain free from infection Outcome: Adequate for Discharge Goal: Diagnostic test results will improve Outcome: Adequate for Discharge Goal: Respiratory complications will improve Outcome: Adequate for Discharge Goal: Cardiovascular complication will be avoided Outcome: Adequate for Discharge   Problem: Activity: Goal: Risk for activity intolerance will decrease Outcome: Adequate for Discharge   Problem: Nutrition: Goal: Adequate nutrition will be maintained Outcome: Adequate for Discharge   Problem: Coping: Goal: Level of anxiety will decrease Outcome: Adequate for Discharge   

## 2023-11-06 ENCOUNTER — Other Ambulatory Visit (HOSPITAL_BASED_OUTPATIENT_CLINIC_OR_DEPARTMENT_OTHER): Payer: Self-pay

## 2023-11-10 DEATH — deceased
